# Patient Record
Sex: Female | Born: 1945 | Race: Black or African American | Hispanic: No | Marital: Married | Smoking: Never smoker
Health system: Southern US, Community
[De-identification: ages and names within clinical notes are randomized; demographics above are authoritative.]

## PROBLEM LIST (undated history)

## (undated) DIAGNOSIS — E785 Hyperlipidemia, unspecified: Secondary | ICD-10-CM

## (undated) DIAGNOSIS — I10 Essential (primary) hypertension: Secondary | ICD-10-CM

## (undated) HISTORY — DX: Essential (primary) hypertension: I10

## (undated) HISTORY — DX: Hyperlipidemia, unspecified: E78.5

---

## 2003-09-12 ENCOUNTER — Ambulatory Visit (HOSPITAL_COMMUNITY): Admission: RE | Admit: 2003-09-12 | Discharge: 2003-09-12 | Payer: Self-pay | Admitting: Gastroenterology

## 2005-02-18 ENCOUNTER — Other Ambulatory Visit: Admission: RE | Admit: 2005-02-18 | Discharge: 2005-02-18 | Payer: Self-pay | Admitting: Family Medicine

## 2006-06-04 ENCOUNTER — Other Ambulatory Visit: Admission: RE | Admit: 2006-06-04 | Discharge: 2006-06-04 | Payer: Self-pay | Admitting: Family Medicine

## 2007-06-09 ENCOUNTER — Other Ambulatory Visit: Admission: RE | Admit: 2007-06-09 | Discharge: 2007-06-09 | Payer: Self-pay | Admitting: Family Medicine

## 2008-09-21 ENCOUNTER — Other Ambulatory Visit: Admission: RE | Admit: 2008-09-21 | Discharge: 2008-09-21 | Payer: Self-pay | Admitting: Family Medicine

## 2009-11-19 ENCOUNTER — Other Ambulatory Visit: Admission: RE | Admit: 2009-11-19 | Discharge: 2009-11-19 | Payer: Self-pay | Admitting: Family Medicine

## 2010-06-13 NOTE — Op Note (Signed)
[name], Rebecca Gross                         ACCOUNT NO.:  [id number]   MEDICAL RECORD NO.:  [id number]                   PATIENT TYPE:  AMB   LOCATION:  ENDO                                 FACILITY:  [facility]   PHYSICIAN:  Anselmo Rod, M.D.               DATE OF BIRTH:  [birth year]   DATE OF PROCEDURE:  09/12/2003  DATE OF DISCHARGE:                                 OPERATIVE REPORT   PROCEDURE:  Screening colonoscopy.   ENDOSCOPIST:  Anselmo Rod, M.D.   INSTRUMENT USED:  Olympus video colonoscope.   INDICATIONS FOR PROCEDURE:  A 64 year old [place] American female with  history of occasional bright red blood per rectum with hard stool.  Screening colonoscopy to rule out colonic polyps, masses, etc.   PREPROCEDURE PREPARATION:  Informed consent was procured from the patient.  The patient was fasted for eight hours prior to the procedure and prepped  with a bottle of magnesium citrate and a gallon of GoLYTELY the night prior  to the procedure.   PREPROCEDURE PHYSICAL:  VITAL SIGNS:  The patient had stable vital signs.  CHEST:  Clear to auscultation.  HEART:  S1 and S2 regular.  ABDOMEN:  Soft with normal bowel sounds.   DESCRIPTION OF PROCEDURE:  The patient was placed in the left lateral  decubitus position, sedated with 75 mg Demerol and 7.5 mg Versed in slow  incremental doses.  Once the patient was adequately sedated and maintained  on low-flow oxygen and continuous cardiac monitoring, the Olympus video  colonoscope was advanced from the rectum to the cecum.  The appendiceal  orifice and ileocecal valve were clearly visualized and photographed.  No  masses, polyps, erosions, ulcerations, or diverticula were seen.  Prominent  internal hemorrhoids were seen on retroflexion in the rectum.  The patient  tolerated the procedure well without immediate complications.   IMPRESSION:  1. Normal colonoscopy to the cecum except for small internal hemorrhoids.  2. 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   RECOMMENDATIONS:  1. Continue high fiber diet with liberal fluid intake.  2. Outpatient followup as needed __________ in the future.  3. Repeat colonoscopy in the next 10 years unless the patient has any     abnormal GI symptoms prior to that.                                               Anselmo Rod, M.D.    JNM/MEDQ  D:  09/12/2003  T:  09/12/2003  Job:  [id number]   cc:   Gabriel Earing, M.D.  [address]  [place]  [place] [address]  Fax: [phone]

## 2011-06-01 DIAGNOSIS — E669 Obesity, unspecified: Secondary | ICD-10-CM | POA: Diagnosis not present

## 2011-06-01 DIAGNOSIS — I10 Essential (primary) hypertension: Secondary | ICD-10-CM | POA: Diagnosis not present

## 2011-06-01 DIAGNOSIS — E7800 Pure hypercholesterolemia, unspecified: Secondary | ICD-10-CM | POA: Diagnosis not present

## 2011-11-26 DIAGNOSIS — Z1231 Encounter for screening mammogram for malignant neoplasm of breast: Secondary | ICD-10-CM | POA: Diagnosis not present

## 2012-03-02 DIAGNOSIS — E7800 Pure hypercholesterolemia, unspecified: Secondary | ICD-10-CM | POA: Diagnosis not present

## 2012-03-02 DIAGNOSIS — Z1211 Encounter for screening for malignant neoplasm of colon: Secondary | ICD-10-CM | POA: Diagnosis not present

## 2012-03-02 DIAGNOSIS — Z0000 Encounter for general adult medical examination without abnormal findings: Secondary | ICD-10-CM | POA: Diagnosis not present

## 2012-03-02 DIAGNOSIS — Z1331 Encounter for screening for depression: Secondary | ICD-10-CM | POA: Diagnosis not present

## 2012-03-02 DIAGNOSIS — I10 Essential (primary) hypertension: Secondary | ICD-10-CM | POA: Diagnosis not present

## 2012-03-02 DIAGNOSIS — E669 Obesity, unspecified: Secondary | ICD-10-CM | POA: Diagnosis not present

## 2012-05-02 DIAGNOSIS — Z1211 Encounter for screening for malignant neoplasm of colon: Secondary | ICD-10-CM | POA: Diagnosis not present

## 2012-09-22 DIAGNOSIS — Z6830 Body mass index (BMI) 30.0-30.9, adult: Secondary | ICD-10-CM | POA: Diagnosis not present

## 2012-09-22 DIAGNOSIS — I10 Essential (primary) hypertension: Secondary | ICD-10-CM | POA: Diagnosis not present

## 2012-09-22 DIAGNOSIS — E7800 Pure hypercholesterolemia, unspecified: Secondary | ICD-10-CM | POA: Diagnosis not present

## 2012-09-22 DIAGNOSIS — E669 Obesity, unspecified: Secondary | ICD-10-CM | POA: Diagnosis not present

## 2013-02-08 DIAGNOSIS — Z780 Asymptomatic menopausal state: Secondary | ICD-10-CM | POA: Diagnosis not present

## 2013-02-08 DIAGNOSIS — Z1231 Encounter for screening mammogram for malignant neoplasm of breast: Secondary | ICD-10-CM | POA: Diagnosis not present

## 2013-06-30 DIAGNOSIS — I10 Essential (primary) hypertension: Secondary | ICD-10-CM | POA: Diagnosis not present

## 2013-06-30 DIAGNOSIS — E669 Obesity, unspecified: Secondary | ICD-10-CM | POA: Diagnosis not present

## 2013-06-30 DIAGNOSIS — E7800 Pure hypercholesterolemia, unspecified: Secondary | ICD-10-CM | POA: Diagnosis not present

## 2013-06-30 DIAGNOSIS — Z6830 Body mass index (BMI) 30.0-30.9, adult: Secondary | ICD-10-CM | POA: Diagnosis not present

## 2014-03-22 DIAGNOSIS — Z1231 Encounter for screening mammogram for malignant neoplasm of breast: Secondary | ICD-10-CM | POA: Diagnosis not present

## 2014-03-22 DIAGNOSIS — Z803 Family history of malignant neoplasm of breast: Secondary | ICD-10-CM | POA: Diagnosis not present

## 2014-05-29 DIAGNOSIS — Z6830 Body mass index (BMI) 30.0-30.9, adult: Secondary | ICD-10-CM | POA: Diagnosis not present

## 2014-05-29 DIAGNOSIS — E780 Pure hypercholesterolemia: Secondary | ICD-10-CM | POA: Diagnosis not present

## 2014-05-29 DIAGNOSIS — Z0001 Encounter for general adult medical examination with abnormal findings: Secondary | ICD-10-CM | POA: Diagnosis not present

## 2014-05-29 DIAGNOSIS — E6609 Other obesity due to excess calories: Secondary | ICD-10-CM | POA: Diagnosis not present

## 2014-05-29 DIAGNOSIS — Z23 Encounter for immunization: Secondary | ICD-10-CM | POA: Diagnosis not present

## 2014-05-29 DIAGNOSIS — Z1211 Encounter for screening for malignant neoplasm of colon: Secondary | ICD-10-CM | POA: Diagnosis not present

## 2014-05-29 DIAGNOSIS — I10 Essential (primary) hypertension: Secondary | ICD-10-CM | POA: Diagnosis not present

## 2014-06-19 DIAGNOSIS — Z1211 Encounter for screening for malignant neoplasm of colon: Secondary | ICD-10-CM | POA: Diagnosis not present

## 2014-06-19 DIAGNOSIS — K5900 Constipation, unspecified: Secondary | ICD-10-CM | POA: Diagnosis not present

## 2014-07-18 DIAGNOSIS — K635 Polyp of colon: Secondary | ICD-10-CM | POA: Diagnosis not present

## 2014-07-18 DIAGNOSIS — Z1211 Encounter for screening for malignant neoplasm of colon: Secondary | ICD-10-CM | POA: Diagnosis not present

## 2014-07-18 DIAGNOSIS — D124 Benign neoplasm of descending colon: Secondary | ICD-10-CM | POA: Diagnosis not present

## 2014-12-05 DIAGNOSIS — E7800 Pure hypercholesterolemia, unspecified: Secondary | ICD-10-CM | POA: Diagnosis not present

## 2014-12-05 DIAGNOSIS — E669 Obesity, unspecified: Secondary | ICD-10-CM | POA: Diagnosis not present

## 2014-12-05 DIAGNOSIS — I10 Essential (primary) hypertension: Secondary | ICD-10-CM | POA: Diagnosis not present

## 2014-12-05 DIAGNOSIS — Z6830 Body mass index (BMI) 30.0-30.9, adult: Secondary | ICD-10-CM | POA: Diagnosis not present

## 2015-05-14 DIAGNOSIS — Z13820 Encounter for screening for osteoporosis: Secondary | ICD-10-CM | POA: Diagnosis not present

## 2015-05-14 DIAGNOSIS — Z1231 Encounter for screening mammogram for malignant neoplasm of breast: Secondary | ICD-10-CM | POA: Diagnosis not present

## 2015-05-14 DIAGNOSIS — Z803 Family history of malignant neoplasm of breast: Secondary | ICD-10-CM | POA: Diagnosis not present

## 2015-05-28 DIAGNOSIS — H6123 Impacted cerumen, bilateral: Secondary | ICD-10-CM | POA: Diagnosis not present

## 2015-07-01 DIAGNOSIS — Z0000 Encounter for general adult medical examination without abnormal findings: Secondary | ICD-10-CM | POA: Diagnosis not present

## 2015-07-01 DIAGNOSIS — E7800 Pure hypercholesterolemia, unspecified: Secondary | ICD-10-CM | POA: Diagnosis not present

## 2015-07-01 DIAGNOSIS — E669 Obesity, unspecified: Secondary | ICD-10-CM | POA: Diagnosis not present

## 2015-07-01 DIAGNOSIS — I10 Essential (primary) hypertension: Secondary | ICD-10-CM | POA: Diagnosis not present

## 2015-11-19 DIAGNOSIS — L299 Pruritus, unspecified: Secondary | ICD-10-CM | POA: Diagnosis not present

## 2015-11-19 DIAGNOSIS — I160 Hypertensive urgency: Secondary | ICD-10-CM | POA: Diagnosis not present

## 2015-11-19 DIAGNOSIS — M7662 Achilles tendinitis, left leg: Secondary | ICD-10-CM | POA: Diagnosis not present

## 2015-11-29 DIAGNOSIS — M7662 Achilles tendinitis, left leg: Secondary | ICD-10-CM | POA: Diagnosis not present

## 2015-12-10 ENCOUNTER — Other Ambulatory Visit: Payer: Self-pay | Admitting: Physician Assistant

## 2015-12-10 ENCOUNTER — Ambulatory Visit
Admission: RE | Admit: 2015-12-10 | Discharge: 2015-12-10 | Disposition: A | Discharge status: Home / Self Care | Payer: Medicare Other | Source: Ambulatory Visit | Attending: Physician Assistant | Admitting: Physician Assistant

## 2015-12-10 DIAGNOSIS — M19072 Primary osteoarthritis, left ankle and foot: Secondary | ICD-10-CM | POA: Diagnosis not present

## 2015-12-10 DIAGNOSIS — R52 Pain, unspecified: Secondary | ICD-10-CM

## 2015-12-10 DIAGNOSIS — M25579 Pain in unspecified ankle and joints of unspecified foot: Secondary | ICD-10-CM | POA: Diagnosis not present

## 2016-03-17 DIAGNOSIS — E7800 Pure hypercholesterolemia, unspecified: Secondary | ICD-10-CM | POA: Diagnosis not present

## 2016-03-17 DIAGNOSIS — I10 Essential (primary) hypertension: Secondary | ICD-10-CM | POA: Diagnosis not present

## 2016-03-17 DIAGNOSIS — E669 Obesity, unspecified: Secondary | ICD-10-CM | POA: Diagnosis not present

## 2016-06-08 DIAGNOSIS — Z1231 Encounter for screening mammogram for malignant neoplasm of breast: Secondary | ICD-10-CM | POA: Diagnosis not present

## 2016-06-08 DIAGNOSIS — Z803 Family history of malignant neoplasm of breast: Secondary | ICD-10-CM | POA: Diagnosis not present

## 2016-06-09 DIAGNOSIS — M25572 Pain in left ankle and joints of left foot: Secondary | ICD-10-CM | POA: Diagnosis not present

## 2016-07-02 DIAGNOSIS — M25572 Pain in left ankle and joints of left foot: Secondary | ICD-10-CM | POA: Diagnosis not present

## 2016-10-02 DIAGNOSIS — Z1159 Encounter for screening for other viral diseases: Secondary | ICD-10-CM | POA: Diagnosis not present

## 2016-10-02 DIAGNOSIS — E7800 Pure hypercholesterolemia, unspecified: Secondary | ICD-10-CM | POA: Diagnosis not present

## 2016-10-02 DIAGNOSIS — Z0000 Encounter for general adult medical examination without abnormal findings: Secondary | ICD-10-CM | POA: Diagnosis not present

## 2016-10-02 DIAGNOSIS — E669 Obesity, unspecified: Secondary | ICD-10-CM | POA: Diagnosis not present

## 2016-10-02 DIAGNOSIS — I10 Essential (primary) hypertension: Secondary | ICD-10-CM | POA: Diagnosis not present

## 2017-04-13 DIAGNOSIS — I10 Essential (primary) hypertension: Secondary | ICD-10-CM | POA: Diagnosis not present

## 2017-04-13 DIAGNOSIS — E7800 Pure hypercholesterolemia, unspecified: Secondary | ICD-10-CM | POA: Diagnosis not present

## 2017-04-29 DIAGNOSIS — R7301 Impaired fasting glucose: Secondary | ICD-10-CM | POA: Diagnosis not present

## 2017-05-13 IMAGING — CR DG ANKLE COMPLETE 3+V*L*
3 series · 3 of 3 positions shown · non-contrast
Comparison: None.

CLINICAL DATA: Pain for 1 month

EXAM:
LEFT ANKLE COMPLETE - 3+ VIEW

[t ankle joint ap left]
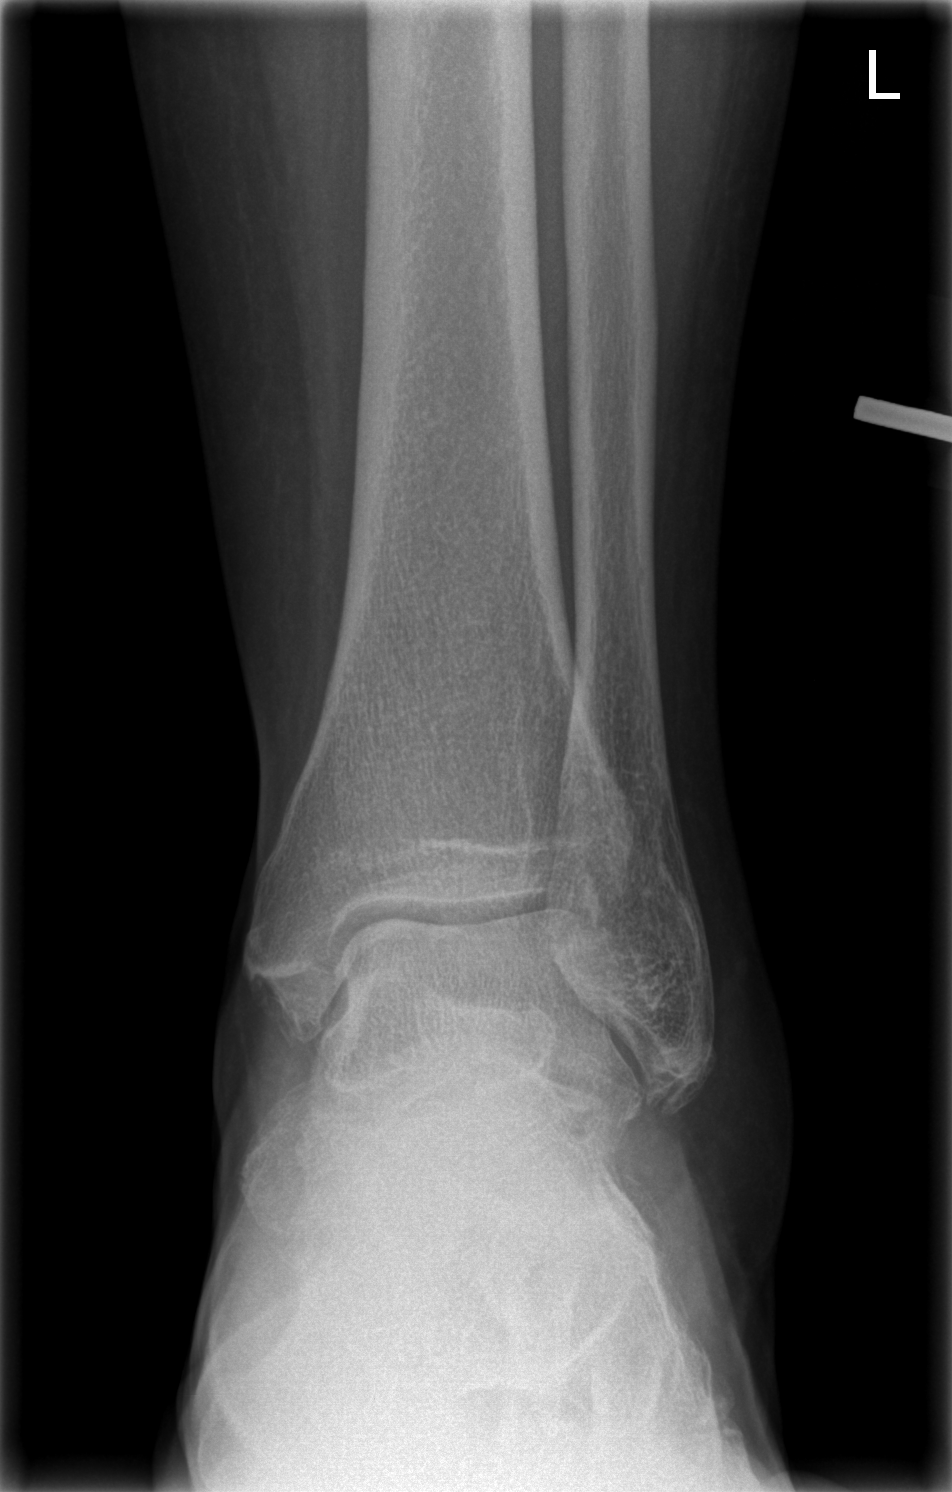

[t ankle joint oblique left]
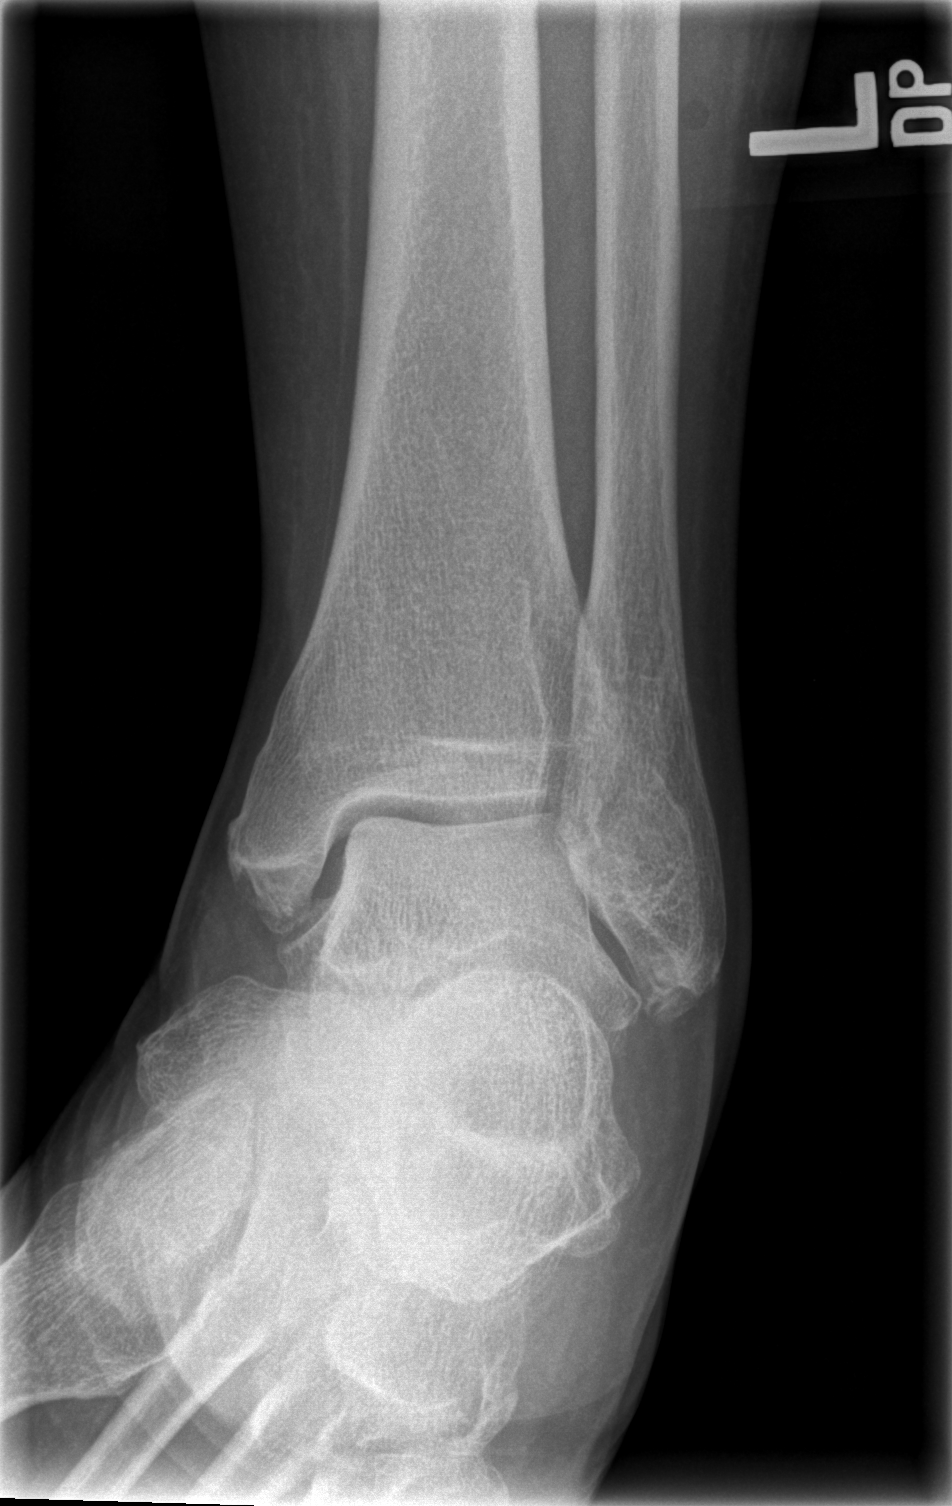

[t ankle joint lat left]
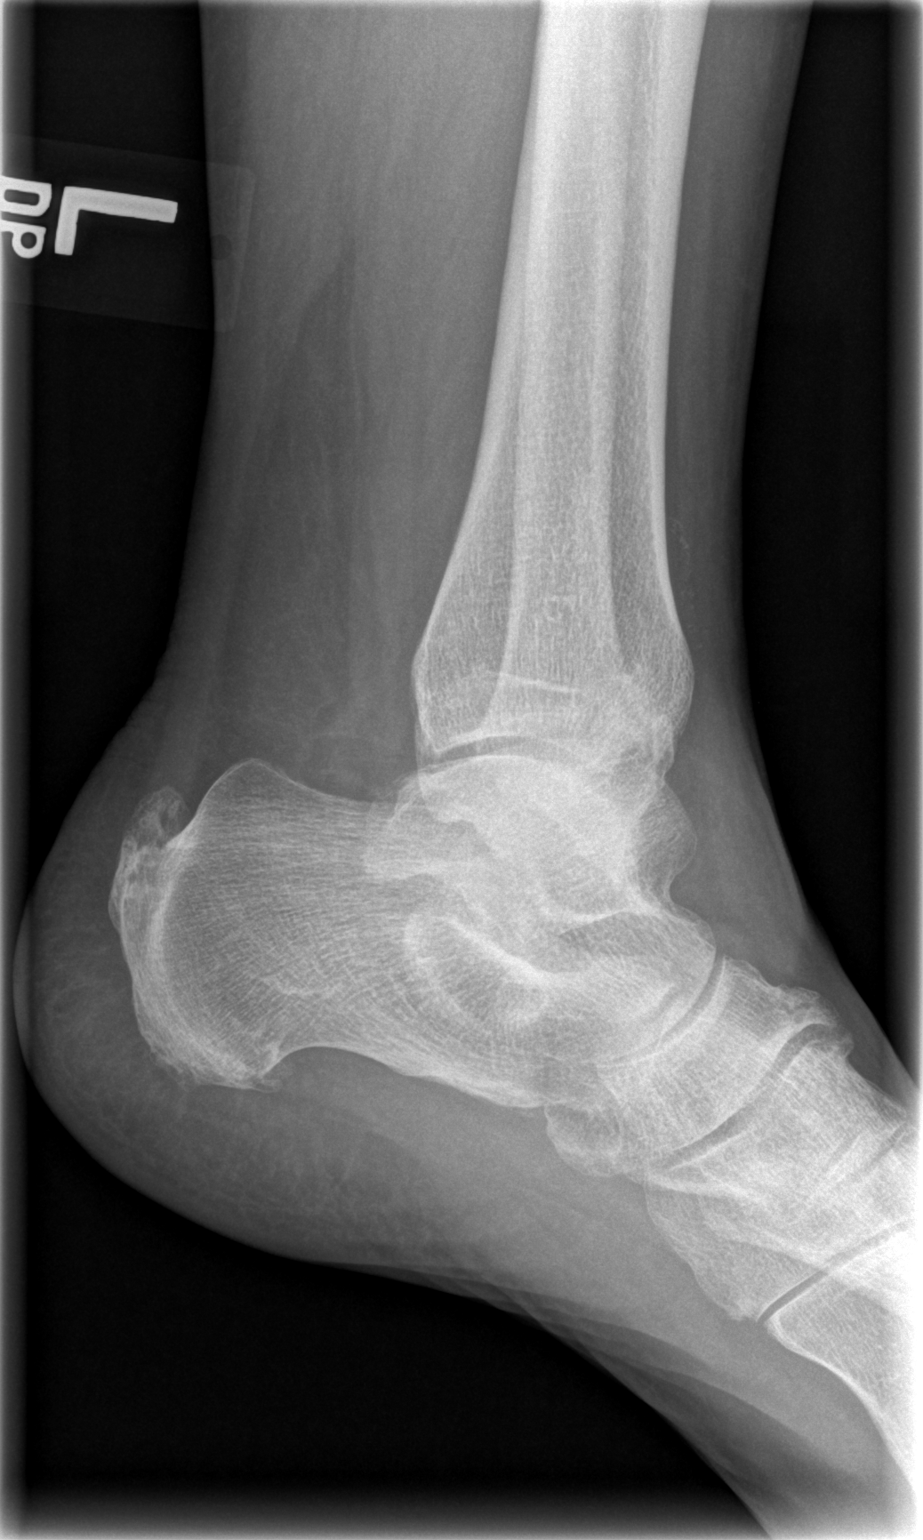

[3 of 3 positions shown; findings below may reference images not displayed]

FINDINGS: Frontal, lateral, and oblique views obtained. There is soft tissue
swelling laterally. There is no evident fracture or joint effusion.
The ankle mortise appears intact. There is moderate generalized
osteoarthritic change in the ankle joint. There is spurring in the
dorsal midfoot. There are spurs arising from the inferior and
posterior calcaneus.
IMPRESSION: Osteoarthritic change in the ankle joint. Soft tissue swelling
laterally. No evident fracture. Ankle mortise appears intact. There
are calcaneal spurs as well as spurring in the dorsal midfoot.

## 2017-07-08 DIAGNOSIS — Z1231 Encounter for screening mammogram for malignant neoplasm of breast: Secondary | ICD-10-CM | POA: Diagnosis not present

## 2017-07-08 DIAGNOSIS — Z803 Family history of malignant neoplasm of breast: Secondary | ICD-10-CM | POA: Diagnosis not present

## 2017-07-16 DIAGNOSIS — E7800 Pure hypercholesterolemia, unspecified: Secondary | ICD-10-CM | POA: Diagnosis not present

## 2017-10-21 DIAGNOSIS — Z0000 Encounter for general adult medical examination without abnormal findings: Secondary | ICD-10-CM | POA: Diagnosis not present

## 2017-10-21 DIAGNOSIS — Z6830 Body mass index (BMI) 30.0-30.9, adult: Secondary | ICD-10-CM | POA: Diagnosis not present

## 2017-10-21 DIAGNOSIS — E7800 Pure hypercholesterolemia, unspecified: Secondary | ICD-10-CM | POA: Diagnosis not present

## 2017-10-21 DIAGNOSIS — E669 Obesity, unspecified: Secondary | ICD-10-CM | POA: Diagnosis not present

## 2017-10-21 DIAGNOSIS — R7301 Impaired fasting glucose: Secondary | ICD-10-CM | POA: Diagnosis not present

## 2017-10-21 DIAGNOSIS — I10 Essential (primary) hypertension: Secondary | ICD-10-CM | POA: Diagnosis not present

## 2017-10-21 DIAGNOSIS — Z23 Encounter for immunization: Secondary | ICD-10-CM | POA: Diagnosis not present

## 2018-04-11 DIAGNOSIS — H6123 Impacted cerumen, bilateral: Secondary | ICD-10-CM | POA: Diagnosis not present

## 2018-04-27 DIAGNOSIS — Z6830 Body mass index (BMI) 30.0-30.9, adult: Secondary | ICD-10-CM | POA: Diagnosis not present

## 2018-04-27 DIAGNOSIS — I10 Essential (primary) hypertension: Secondary | ICD-10-CM | POA: Diagnosis not present

## 2018-04-27 DIAGNOSIS — E669 Obesity, unspecified: Secondary | ICD-10-CM | POA: Diagnosis not present

## 2018-04-27 DIAGNOSIS — R7301 Impaired fasting glucose: Secondary | ICD-10-CM | POA: Diagnosis not present

## 2018-04-27 DIAGNOSIS — E7800 Pure hypercholesterolemia, unspecified: Secondary | ICD-10-CM | POA: Diagnosis not present

## 2018-05-24 ENCOUNTER — Other Ambulatory Visit: Payer: Self-pay

## 2018-05-24 ENCOUNTER — Ambulatory Visit (INDEPENDENT_AMBULATORY_CARE_PROVIDER_SITE_OTHER): Payer: Medicare Other | Admitting: Family Medicine

## 2018-05-24 ENCOUNTER — Encounter: Payer: Self-pay | Admitting: Family Medicine

## 2018-05-24 VITALS — Resp 16

## 2018-05-24 DIAGNOSIS — W19XXXA Unspecified fall, initial encounter: Secondary | ICD-10-CM | POA: Diagnosis not present

## 2018-05-24 DIAGNOSIS — M25562 Pain in left knee: Secondary | ICD-10-CM

## 2018-05-24 DIAGNOSIS — Y92009 Unspecified place in unspecified non-institutional (private) residence as the place of occurrence of the external cause: Secondary | ICD-10-CM | POA: Diagnosis not present

## 2018-05-24 DIAGNOSIS — T1490XA Injury, unspecified, initial encounter: Secondary | ICD-10-CM

## 2018-05-24 DIAGNOSIS — I10 Essential (primary) hypertension: Secondary | ICD-10-CM

## 2018-05-24 MED ORDER — NAPROXEN 375 MG PO TABS: 375.0000 mg | ORAL_TABLET | Freq: Two times a day (BID) | ORAL | 0 refills | Status: AC

## 2018-05-24 NOTE — Assessment & Plan Note (Signed)
Problem does not seem to be well controlled. Recommend taking Amlodipine 5 mg daily,continue monitoring BP. Side effects of NSAID's discussed. Continue following with PCP.

## 2018-05-24 NOTE — Progress Notes (Signed)
Virtual Visit via Video Note   I connected with Rebecca Gross on 05/24/18 at  3:00 PM EDT by a video enabled telemedicine application and verified that I am speaking with the correct person using two identifiers.  Location patient: home Location provider:work or home office Persons participating in the virtual visit: patient, provider  I discussed the limitations of evaluation and management by telemedicine and the availability of in person appointments. She expressed understanding and agreed to proceed.   HPI: PCP was not available, Dr Valere Dross. Rebecca Gross is a 74 yo AA female c/o left knee pain after suffering a fall from treadmill 5 days ago. She was walking the treadmill, lost balance and slid. Landed on right knee, treadmill continue moving causing superficial excoriating on left knee and some on left thigh.  Left knee is "little sore",intermittent 4/10, exacerbated by movement but with no limitation of ROM or deformity. Alleviated by rest. Problem is improving.  She has not tried OTC medications. Denies Hx of knee OA.  She was trying to reach pcp and just received recommendations about applying OTC topical treatment,Arnica cream.    Hx of HTN, BP reading 140's-150's/80's. She does not take Amlodipine 5 mg daily but q 2 days. Denies severe/frequent headache, visual changes, chest pain, dyspnea, palpitation, claudication, focal weakness, or edema.  Denies Hx of DM or renal disease.  ROS: See pertinent positives and negatives per HPI. COVID-19 screening questions: Denies new fever,cough,sore throat,or possible exposure to COVID-19. Negative for loss in the sense of smell or taste.  Past Medical History:  Diagnosis Date  . Hyperlipidemia   . Hypertension     No family history on file.  Social History   Socioeconomic History  . Marital status: Unknown    Spouse name: Not on file  . Number of children: Not on file  . Years of education: Not on file  . Highest education  level: Not on file  Occupational History  . Not on file  Social Needs  . Financial resource strain: Not on file  . Food insecurity:    Worry: Not on file    Inability: Not on file  . Transportation needs:    Medical: Not on file    Non-medical: Not on file  Tobacco Use  . Smoking status: Never Smoker  . Smokeless tobacco: Never Used  Substance and Sexual Activity  . Alcohol use: Not on file  . Drug use: Not on file  . Sexual activity: Not on file  Lifestyle  . Physical activity:    Days per week: Not on file    Minutes per session: Not on file  . Stress: Not on file  Relationships  . Social connections:    Talks on phone: Not on file    Gets together: Not on file    Attends religious service: Not on file    Active member of club or organization: Not on file    Attends meetings of clubs or organizations: Not on file    Relationship status: Not on file  . Intimate partner violence:    Fear of current or ex partner: Not on file    Emotionally abused: Not on file    Physically abused: Not on file    Forced sexual activity: Not on file  Other Topics Concern  . Not on file  Social History Narrative  . Not on file      Current Outpatient Medications:  .  naproxen (NAPROSYN) 375 MG tablet, Take 1 tablet (  375 mg total) by mouth 2 (two) times daily with a meal for 7 days., Disp: 14 tablet, Rfl: 0  EXAM:  VITALS per patient if applicable:Resp 16   GENERAL: alert, oriented, appears well and in no acute distress  HEENT: atraumatic, conjunctiva clear, no obvious facial abnormalities on inspection.  NECK: normal movements of the head and neck  LUNGS: on inspection no signs of respiratory distress, breathing rate appears normal, no obvious gross SOB, gasping or wheezing  CV: no obvious cyanosis  Rebecca: moves all visible extremities without noticeable abnormality. Left knee no limitation of flexion and extension, not significant pain elicited,no deformity appreciated.   SKIN: Superficial excoriation on anterior aspect left knee and in the lower degree on anterior rapid aspect of right thigh. See pictures.  PSYCH/NEURO: pleasant and cooperative, no obvious depression or anxiety, speech and thought processing grossly intact    Anterior and upper aspect right thigh.   Anterior aspect left knee.  ASSESSMENT AND PLAN:  Discussed the following assessment and plan:  Acute pain of left knee Pain is improving and it is not limiting daily activities. I do not think imaging is needed at this time. Naproxen 375 mg bid for 5-7 days with food recommended,side effects discussed.  Fall at home, initial encounter Fall precaution discussed.  Soft tissue injury Keep area clean with soap and water. Monitor for signs of infection. Td recommended if not up to date.   Hypertension, essential, benign Problem does not seem to be well controlled. Recommend taking Amlodipine 5 mg daily,continue monitoring BP. Side effects of NSAID's discussed. Continue following with PCP.     I discussed the assessment and treatment plan with the patient. The patient was provided an opportunity to ask questions and all were answered. The patient agreed with the plan and demonstrated an understanding of the instructions.   The patient was advised to call back or seek an in-person evaluation if the symptoms worsen or if the condition fails to improve as anticipated.  Return if symptoms worsen or fail to improve.     [place], MD

## 2018-06-03 DIAGNOSIS — S80211A Abrasion, right knee, initial encounter: Secondary | ICD-10-CM | POA: Diagnosis not present

## 2018-06-03 DIAGNOSIS — S80212A Abrasion, left knee, initial encounter: Secondary | ICD-10-CM | POA: Diagnosis not present

## 2018-06-03 DIAGNOSIS — T148XXA Other injury of unspecified body region, initial encounter: Secondary | ICD-10-CM | POA: Diagnosis not present

## 2018-07-20 DIAGNOSIS — Z803 Family history of malignant neoplasm of breast: Secondary | ICD-10-CM | POA: Diagnosis not present

## 2018-07-20 DIAGNOSIS — Z1231 Encounter for screening mammogram for malignant neoplasm of breast: Secondary | ICD-10-CM | POA: Diagnosis not present

## 2018-10-12 ENCOUNTER — Other Ambulatory Visit: Payer: Self-pay

## 2018-10-12 DIAGNOSIS — Z20822 Contact with and (suspected) exposure to covid-19: Secondary | ICD-10-CM

## 2018-10-13 LAB — NOVEL CORONAVIRUS, NAA: SARS-CoV-2, NAA: NOT DETECTED

## 2018-11-18 DIAGNOSIS — Z23 Encounter for immunization: Secondary | ICD-10-CM | POA: Diagnosis not present

## 2019-05-05 ENCOUNTER — Other Ambulatory Visit: Payer: Self-pay

## 2019-05-05 ENCOUNTER — Ambulatory Visit (INDEPENDENT_AMBULATORY_CARE_PROVIDER_SITE_OTHER): Payer: Medicare Other | Admitting: Otolaryngology

## 2019-05-05 DIAGNOSIS — H6123 Impacted cerumen, bilateral: Secondary | ICD-10-CM | POA: Diagnosis not present

## 2019-05-05 NOTE — Progress Notes (Signed)
HPI: Rebecca Gross is a 74 y.o. female who presents for evaluation of cerumen buildup in both ears.  She was last seen 03/27/2018.  She complains of some blockage in her ears.  She has been using Q-tips..  Past Medical History:  Diagnosis Date  . Hyperlipidemia   . Hypertension     Social History   Socioeconomic History  . Marital status: Unknown    Spouse name: Not on file  . Number of children: Not on file  . Years of education: Not on file  . Highest education level: Not on file  Occupational History  . Not on file  Tobacco Use  . Smoking status: Never Smoker  . Smokeless tobacco: Never Used  Substance and Sexual Activity  . Alcohol use: Not on file  . Drug use: Not on file  . Sexual activity: Not on file  Other Topics Concern  . Not on file  Social History Narrative  . Not on file   Social Determinants of Health   Financial Resource Strain:   . Difficulty of Paying Living Expenses:   Food Insecurity:   . Worried About Charity fundraiser in the Last Year:   . Arboriculturist in the Last Year:   Transportation Needs:   . Film/video editor (Medical):   Marland Kitchen Lack of Transportation (Non-Medical):   Physical Activity:   . Days of Exercise per Week:   . Minutes of Exercise per Session:   Stress:   . Feeling of Stress :   Social Connections:   . Frequency of Communication with Friends and Family:   . Frequency of Social Gatherings with Friends and Family:   . Attends Religious Services:   . Active Member of Clubs or Organizations:   . Attends Archivist Meetings:   Marland Kitchen Marital Status:    No family history on file. No Known Allergies Prior to Admission medications   Not on File     Positive ROS: Otherwise negative  All other systems have been reviewed and were otherwise negative with the exception of those mentioned in the HPI and as above.  Physical Exam: Constitutional: Alert, well-appearing, no acute distress Ears: External ears without  lesions or tenderness. Ear canals she has wax pushed down adjacent to the both TMs from use of Q-tips.  This was cleaned in the office using suction and forceps.  TMs were clear bilaterally.. Nasal: External nose without lesions. Clear nasal passages Oral: Oropharynx clear. Neck: No palpable adenopathy or masses Respiratory: Breathing comfortably  Skin: No facial/neck lesions or rash noted.  Cerumen impaction removal  Date/Time: 05/05/2019 5:10 PM Performed by: Rozetta Nunnery, MD Authorized by: Rozetta Nunnery, MD   Consent:    Consent obtained:  Verbal   Consent given by:  Patient   Risks discussed:  Pain and bleeding Procedure details:    Location:  L ear and R ear   Procedure type: suction and forceps   Post-procedure details:    Inspection:  TM intact and canal normal   Hearing quality:  Improved   Patient tolerance of procedure:  Tolerated well, no immediate complications Comments:     TMs are clear bilaterally.    Assessment: Bilateral cerumen impactions  Plan: She will follow-up as needed.  Cautioned her about not using Q-tips.  Radene Journey, MD

## 2019-06-21 DIAGNOSIS — E669 Obesity, unspecified: Secondary | ICD-10-CM | POA: Diagnosis not present

## 2019-06-21 DIAGNOSIS — E7800 Pure hypercholesterolemia, unspecified: Secondary | ICD-10-CM | POA: Diagnosis not present

## 2019-06-21 DIAGNOSIS — Z0000 Encounter for general adult medical examination without abnormal findings: Secondary | ICD-10-CM | POA: Diagnosis not present

## 2019-06-21 DIAGNOSIS — R7301 Impaired fasting glucose: Secondary | ICD-10-CM | POA: Diagnosis not present

## 2019-06-21 DIAGNOSIS — I10 Essential (primary) hypertension: Secondary | ICD-10-CM | POA: Diagnosis not present

## 2019-07-24 DIAGNOSIS — M85852 Other specified disorders of bone density and structure, left thigh: Secondary | ICD-10-CM | POA: Diagnosis not present

## 2019-07-24 DIAGNOSIS — Z1231 Encounter for screening mammogram for malignant neoplasm of breast: Secondary | ICD-10-CM | POA: Diagnosis not present

## 2019-07-24 DIAGNOSIS — Z780 Asymptomatic menopausal state: Secondary | ICD-10-CM | POA: Diagnosis not present

## 2019-09-27 DIAGNOSIS — E7800 Pure hypercholesterolemia, unspecified: Secondary | ICD-10-CM | POA: Diagnosis not present

## 2019-11-25 ENCOUNTER — Ambulatory Visit: Payer: Medicare Other | Attending: Internal Medicine

## 2019-11-25 DIAGNOSIS — Z23 Encounter for immunization: Secondary | ICD-10-CM

## 2019-12-18 DIAGNOSIS — E7800 Pure hypercholesterolemia, unspecified: Secondary | ICD-10-CM | POA: Diagnosis not present

## 2019-12-18 DIAGNOSIS — R7301 Impaired fasting glucose: Secondary | ICD-10-CM | POA: Diagnosis not present

## 2019-12-18 DIAGNOSIS — I10 Essential (primary) hypertension: Secondary | ICD-10-CM | POA: Diagnosis not present

## 2019-12-18 DIAGNOSIS — E669 Obesity, unspecified: Secondary | ICD-10-CM | POA: Diagnosis not present

## 2020-05-17 DIAGNOSIS — H6123 Impacted cerumen, bilateral: Secondary | ICD-10-CM | POA: Diagnosis not present

## 2020-06-21 DIAGNOSIS — Z0000 Encounter for general adult medical examination without abnormal findings: Secondary | ICD-10-CM | POA: Diagnosis not present

## 2020-06-21 DIAGNOSIS — R7301 Impaired fasting glucose: Secondary | ICD-10-CM | POA: Diagnosis not present

## 2020-06-21 DIAGNOSIS — I10 Essential (primary) hypertension: Secondary | ICD-10-CM | POA: Diagnosis not present

## 2020-06-21 DIAGNOSIS — Z1211 Encounter for screening for malignant neoplasm of colon: Secondary | ICD-10-CM | POA: Diagnosis not present

## 2020-06-21 DIAGNOSIS — E669 Obesity, unspecified: Secondary | ICD-10-CM | POA: Diagnosis not present

## 2020-06-21 DIAGNOSIS — E7800 Pure hypercholesterolemia, unspecified: Secondary | ICD-10-CM | POA: Diagnosis not present

## 2020-07-25 DIAGNOSIS — Z1231 Encounter for screening mammogram for malignant neoplasm of breast: Secondary | ICD-10-CM | POA: Diagnosis not present

## 2020-09-19 DIAGNOSIS — Z1211 Encounter for screening for malignant neoplasm of colon: Secondary | ICD-10-CM | POA: Diagnosis not present

## 2020-09-19 DIAGNOSIS — D696 Thrombocytopenia, unspecified: Secondary | ICD-10-CM | POA: Diagnosis not present

## 2020-09-19 DIAGNOSIS — K5900 Constipation, unspecified: Secondary | ICD-10-CM | POA: Diagnosis not present

## 2020-09-19 DIAGNOSIS — Z86010 Personal history of colonic polyps: Secondary | ICD-10-CM | POA: Diagnosis not present

## 2020-09-19 DIAGNOSIS — E669 Obesity, unspecified: Secondary | ICD-10-CM | POA: Diagnosis not present

## 2021-07-02 DIAGNOSIS — Z1211 Encounter for screening for malignant neoplasm of colon: Secondary | ICD-10-CM | POA: Diagnosis not present

## 2021-07-02 DIAGNOSIS — D123 Benign neoplasm of transverse colon: Secondary | ICD-10-CM | POA: Diagnosis not present

## 2021-07-02 DIAGNOSIS — K635 Polyp of colon: Secondary | ICD-10-CM | POA: Diagnosis not present

## 2021-07-02 DIAGNOSIS — K5730 Diverticulosis of large intestine without perforation or abscess without bleeding: Secondary | ICD-10-CM | POA: Diagnosis not present

## 2021-09-09 DIAGNOSIS — H6121 Impacted cerumen, right ear: Secondary | ICD-10-CM | POA: Diagnosis not present

## 2021-11-04 DIAGNOSIS — Z0000 Encounter for general adult medical examination without abnormal findings: Secondary | ICD-10-CM | POA: Diagnosis not present

## 2021-11-04 DIAGNOSIS — M8580 Other specified disorders of bone density and structure, unspecified site: Secondary | ICD-10-CM | POA: Diagnosis not present

## 2021-11-04 DIAGNOSIS — R7301 Impaired fasting glucose: Secondary | ICD-10-CM | POA: Diagnosis not present

## 2021-11-04 DIAGNOSIS — Z23 Encounter for immunization: Secondary | ICD-10-CM | POA: Diagnosis not present

## 2021-11-04 DIAGNOSIS — E7800 Pure hypercholesterolemia, unspecified: Secondary | ICD-10-CM | POA: Diagnosis not present

## 2021-11-04 DIAGNOSIS — I10 Essential (primary) hypertension: Secondary | ICD-10-CM | POA: Diagnosis not present

## 2021-11-04 DIAGNOSIS — E669 Obesity, unspecified: Secondary | ICD-10-CM | POA: Diagnosis not present

## 2022-03-23 DIAGNOSIS — R4189 Other symptoms and signs involving cognitive functions and awareness: Secondary | ICD-10-CM | POA: Diagnosis not present

## 2022-03-25 ENCOUNTER — Other Ambulatory Visit: Payer: Self-pay | Admitting: Family Medicine

## 2022-03-25 DIAGNOSIS — R4189 Other symptoms and signs involving cognitive functions and awareness: Secondary | ICD-10-CM

## 2022-03-26 ENCOUNTER — Encounter: Payer: Self-pay | Admitting: Physician Assistant

## 2022-04-03 ENCOUNTER — Encounter: Payer: Self-pay | Admitting: Physician Assistant

## 2022-04-07 ENCOUNTER — Other Ambulatory Visit: Payer: Medicare Other

## 2022-04-07 ENCOUNTER — Ambulatory Visit (INDEPENDENT_AMBULATORY_CARE_PROVIDER_SITE_OTHER): Payer: Medicare Other | Admitting: Physician Assistant

## 2022-04-07 ENCOUNTER — Encounter: Payer: Self-pay | Admitting: Physician Assistant

## 2022-04-07 VITALS — BP 140/78 | HR 67 | Resp 18 | Ht 62.0 in | Wt 173.0 lb

## 2022-04-07 DIAGNOSIS — R413 Other amnesia: Secondary | ICD-10-CM

## 2022-04-07 MED ORDER — DONEPEZIL HCL 10 MG PO TABS: ORAL_TABLET | ORAL | 3 refills | Status: DC

## 2022-04-07 NOTE — Patient Instructions (Addendum)
It was a pleasure to see you today at our office.   Recommendations:  MRI of the brain, the radiology office will call you to arrange you appointment Check labs today  Start Donepezil 10 mg :Take half tablet (5 mg) daily for 2 weeks, then increase to the full tablet at 10 mg daily.   Feel free to visit Facebook page " [email or web address]" for tips of how to care for people with memory problems.  For assessment of decision of mental capacity and competency:  Call Dr. Anthoney Harada, geriatric psychiatrist at [phone] Follow up in 3 months   Whom to call:  Memory  decline, memory medications: Call our office [phone]   For psychiatric meds, mood meds: Please have your primary care physician manage these medications.    If you have any severe symptoms of a stroke, or other severe issues such as confusion,severe chills or fever, etc call 911 or go to the ER as you may need to be evaluated further       RECOMMENDATIONS FOR ALL PATIENTS WITH MEMORY PROBLEMS: 1. Continue to exercise (Recommend 30 minutes of walking everyday, or 3 hours every week) 2. Increase social interactions - continue going to [place] and enjoy social gatherings with friends and family 3. Eat healthy, avoid fried foods and eat more fruits and vegetables 4. Maintain adequate blood pressure, blood sugar, and blood cholesterol level. Reducing the risk of stroke and cardiovascular disease also helps promoting better memory. 5. Avoid stressful situations. Live a simple life and avoid aggravations. Organize your time and prepare for the next day in anticipation. 6. Sleep well, avoid any interruptions of sleep and avoid any distractions in the bedroom that may interfere with adequate sleep quality 7. Avoid sugar, avoid sweets as there is a strong link between excessive sugar intake, diabetes, and cognitive impairment We discussed the Mediterranean diet, which has been shown to help patients reduce the risk of progressive memory  disorders and reduces cardiovascular risk. This includes eating fish, eat fruits and green leafy vegetables, nuts like almonds and hazelnuts, walnuts, and also use olive oil. Avoid fast foods and fried foods as much as possible. Avoid sweets and sugar as sugar use has been linked to worsening of memory function.  There is always a concern of gradual progression of memory problems. If this is the case, then we may need to adjust level of care according to patient needs. Support, both to the patient and caregiver, should then be put into place.        FALL PRECAUTIONS: Be cautious when walking. Scan the area for obstacles that may increase the risk of trips and falls. When getting up in the mornings, sit up at the edge of the bed for a few minutes before getting out of bed. Consider elevating the bed at the head end to avoid drop of blood pressure when getting up. Walk always in a well-lit room (use night lights in the walls). Avoid area rugs or power cords from appliances in the middle of the walkways. Use a walker or a cane if necessary and consider physical therapy for balance exercise. Get your eyesight checked regularly.  FINANCIAL OVERSIGHT: Supervision, especially oversight when making financial decisions or transactions is also recommended.  HOME SAFETY: Consider the safety of the kitchen when operating appliances like stoves, microwave oven, and blender. Consider having supervision and share cooking responsibilities until no longer able to participate in those. Accidents with firearms and other hazards in the house should be  identified and addressed as well.   ABILITY TO BE LEFT ALONE: If patient is unable to contact 911 operator, consider using LifeLine, or when the need is there, arrange for someone to stay with patients. Smoking is a fire hazard, consider supervision or cessation. Risk of wandering should be assessed by caregiver and if detected at any point, supervision and safe proof  recommendations should be instituted.  MEDICATION SUPERVISION: Inability to self-administer medication needs to be constantly addressed. Implement a mechanism to ensure safe administration of the medications.   DRIVING: Regarding driving, in patients with progressive memory problems, driving will be impaired. We advise to have someone else do the driving if trouble finding directions or if minor accidents are reported. Independent driving assessment is available to determine safety of driving.   If you are interested in the driving assessment, you can contact the following:  [employer] in [place]  [place] [phone] or [phone]    [place] refers to food and lifestyle choices that are based on the traditions of countries located on the [employer]. This way of eating has been shown to help prevent certain conditions and improve outcomes for people who have chronic diseases, like kidney disease and heart disease. What are tips for following this plan? Lifestyle  Cook and eat meals together with your family, when possible. Drink enough fluid to keep your urine clear or pale yellow. Be physically active every day. This includes: Aerobic exercise like running or swimming. Leisure activities like gardening, walking, or housework. Get 7-8 hours of sleep each night. If recommended by your health care provider, drink red wine in moderation. This means 1 glass a day for nonpregnant women and 2 glasses a day for men. A glass of wine equals 5 oz (150 mL). Reading food labels  Check the serving size of packaged foods. For foods such as rice and pasta, the serving size refers to the amount of cooked product, not dry. Check the total fat in packaged foods. Avoid foods that have saturated fat or trans fats. Check the ingredients list for  added sugars, such as corn syrup. Shopping  At the grocery store, buy most of your food from the areas near the walls of the store. This includes: Fresh fruits and vegetables (produce). Grains, beans, nuts, and seeds. Some of these may be available in unpackaged forms or large amounts (in bulk). Fresh seafood. Poultry and eggs. Low-fat dairy products. Buy whole ingredients instead of prepackaged foods. Buy fresh fruits and vegetables in-season from local farmers markets. Buy frozen fruits and vegetables in resealable bags. If you do not have access to quality fresh seafood, buy precooked frozen shrimp or canned fish, such as tuna, salmon, or sardines. Buy small amounts of raw or cooked vegetables, salads, or olives from the deli or salad bar at your store. Stock your pantry so you always have certain foods on hand, such as olive oil, canned tuna, canned tomatoes, rice, pasta, and beans. Cooking  Cook foods with extra-virgin olive oil instead of using butter or other vegetable oils. Have meat as a side dish, and have vegetables or grains as your main dish. This means having meat in small portions or adding small amounts of meat to foods like pasta or stew. Use beans or vegetables instead of meat in common dishes like chili or lasagna. Experiment with different cooking methods. Try roasting or broiling vegetables instead of  steaming or sauteing them. Add frozen vegetables to soups, stews, pasta, or rice. Add nuts or seeds for added healthy fat at each meal. You can add these to yogurt, salads, or vegetable dishes. Marinate fish or vegetables using olive oil, lemon juice, garlic, and fresh herbs. Meal planning  Plan to eat 1 vegetarian meal one day each week. Try to work up to 2 vegetarian meals, if possible. Eat seafood 2 or more times a week. Have healthy snacks readily available, such as: Vegetable sticks with hummus. Greek yogurt. Fruit and nut trail mix. Eat balanced meals throughout  the week. This includes: Fruit: 2-3 servings a day Vegetables: 4-5 servings a day Low-fat dairy: 2 servings a day Fish, poultry, or lean meat: 1 serving a day Beans and legumes: 2 or more servings a week Nuts and seeds: 1-2 servings a day Whole grains: 6-8 servings a day Extra-virgin olive oil: 3-4 servings a day Limit red meat and sweets to only a few servings a month What are my food choices? Mediterranean diet Recommended Grains: Whole-grain pasta. Brown rice. Bulgar wheat. Polenta. Couscous. Whole-wheat bread. [place]. Vegetables: Artichokes. Beets. Broccoli. Cabbage. Carrots. Eggplant. Green beans. Chard. Kale. Spinach. Onions. Leeks. Peas. Squash. Tomatoes. Peppers. Radishes. Fruits: Apples. Apricots. Avocado. Berries. Bananas. Cherries. Dates. Figs. Grapes. Lemons. Melon. Oranges. Peaches. Plums. Pomegranate. Meats and other protein foods: Beans. Almonds. Sunflower seeds. Pine nuts. Peanuts. [place]. Salmon. Scallops. Shrimp. [place]. Tilapia. Clams. Oysters. Eggs. Dairy: Low-fat milk. Cheese. Greek yogurt. Beverages: Water. Red wine. Herbal tea. Fats and oils: Extra virgin olive oil. Avocado oil. Grape seed oil. Sweets and desserts: [place] yogurt with honey. Baked apples. Poached pears. Trail mix. Seasoning and other foods: Basil. Cilantro. Coriander. Cumin. Mint. Parsley. Sage. Rosemary. Tarragon. Garlic. Oregano. Thyme. Pepper. Balsalmic vinegar. Tahini. Hummus. Tomato sauce. Olives. Mushrooms. Limit these Grains: Prepackaged pasta or rice dishes. Prepackaged cereal with added sugar. Vegetables: Deep fried potatoes (french fries). Fruits: Fruit canned in syrup. Meats and other protein foods: Beef. Pork. Lamb. Poultry with skin. Hot dogs. [place] Salines. Dairy: Ice cream. Sour cream. Whole milk. Beverages: Juice. Sugar-sweetened soft drinks. Beer. Liquor and spirits. Fats and oils: Butter. Canola oil. Vegetable oil. Beef fat (tallow). Lard. Sweets and desserts: Cookies. Cakes. Pies.  Candy. Seasoning and other foods: Mayonnaise. Premade sauces and marinades. The items listed may not be a complete list. Talk with your dietitian about what dietary choices are right for you. Summary The Mediterranean diet includes both food and lifestyle choices. Eat a variety of fresh fruits and vegetables, beans, nuts, seeds, and whole grains. Limit the amount of red meat and sweets that you eat. Talk with your health care provider about whether it is safe for you to drink red wine in moderation. This means 1 glass a day for nonpregnant women and 2 glasses a day for men. A glass of wine equals 5 oz (150 mL). This information is not intended to replace advice given to you by your health care provider. Make sure you discuss any questions you have with your health care provider. Document Released: 09/05/2015 Document Revised: 10/08/2015 Document Reviewed: 09/05/2015 Elsevier Interactive Patient Education  2017 [employer].

## 2022-04-07 NOTE — Progress Notes (Signed)
Assessment/Plan:    The patient is seen in neurologic consultation at the request of Mayra Neer, MD for the evaluation of memory.  Rebecca Gross is a very pleasant 77 y.o. year old RH female with a history of hypertension, hyperlipidemia, abnormal fasting glucose, osteopenia, seen today for evaluation of memory loss. MoCA today is 12/30.  During the visit, there is a  significant level of confabulation and anosognosia, and given her strong family history of Alzheimer's disease, high suspicion for this etiology is present although workup is in progress.  Memory Impairment with behavioral disturbance  MRI brain without contrast to assess for underlying structural abnormality and assess vascular load  Check B1  Start Donepezil 10 mg :Take half tablet (5 mg) daily for 2 weeks, then increase to the full tablet at 10 mg daily. Side effects discussed   Continue mood control as per PCP, patient is on Lexapro Continue to control cardiovascular risk factors [place] up in 3 months  Subjective:    The patient is accompanied by her husband who supplements the history.    How long did patient have memory difficulties?  Husband says it is for the last 10 months to 1 year. She denies any issues, "everyone is forgetful".  Her husband reports that she has trouble with remembering how to perform certain tasks, for example recently she could not figure out how to use ATM cards, the remote, the washer and the thermostat.  Patient becomes very defensive, stating that is not true.  She has forgotten some people that recently saw.  repeats oneself?  Endorsed by family "over and over again", although patient denies.  There is some repetitive behavior, including purchasing items forgetting that she already bought them recently, then losing them.  Chart reports that  she bought an iPad and 2 apple watches and then lost them.  Disoriented when walking into a room?  Patient denies   Leaving objects in unusual  places?   Endorsed, she placed bottles of wine under the bed.  Wandering behavior? denies   Any personality changes since last visit?  She is easily irritable, defensive. Any history of depression?:  denies   Hallucinations or paranoia?  Family reports that she may be borderline paranoid, becoming very defensive when approached about her memory. Seizures? denies    Any sleep changes?  Denies vivid dreams, REM behavior or sleepwalking.  "I don't take a pill anymore ".  Sleep apnea? denies   Any hygiene concerns?  denies   Independent of bathing and dressing?  Endorsed  Does the patient need help with medications?  Husband fixes the meds and she is in charge   Who is in charge of the finances? Husband  is in charge because she overplayed them  (for the last 6 months)    Any changes in appetite?   denies     Patient have trouble swallowing?  denies   Does the patient cook?  Yes Any kitchen accidents such as leaving the stove on? Patient denies   Any headaches?  denies   Chronic back pain?  denies   Ambulates with difficulty? denies   Recent falls or head injuries? denies     Vision changes? Unilateral weakness, numbness or tingling?  denies   Any tremors?  denies   Any anosmia?  denies   Any incontinence of urine? denies   Any bowel dysfunction?    denies      Patient lives with husband   History of  heavy alcohol intake? Denies.  She states that the bottle may last 3 days. History of heavy tobacco use? denies   Family history of dementia?   Mother and sister, and, brother had Alzheimer's dementia.  Dose patient drive? Denies any issues, denies getting lost.  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, now she volunteers)   Allergies  Allergen Reactions   Other Other (See Comments)    Current Outpatient Medications  Medication Instructions   amLODipine (NORVASC)  5 MG tablet    Biotin 10 MG TABS 1 tablet   cholecalciferol 25 MCG (1000 UT) tablet    Cyanocobalamin (VITAMIN B 12) 100 MCG LOZG Daily   Omega-3 Fatty Acids (FISH OIL) 1000 MG CAPS 1 capsule   pravastatin (PRAVACHOL) 20 MG tablet    rosuvastatin (CRESTOR) 10 MG tablet    sertraline (ZOLOFT) 50 MG tablet    Turmeric 500 MG CAPS as directed Orally     VITALS:   Vitals:   04/07/22 0944  BP: (!) 140/78  Pulse: 67  Resp: 18  SpO2: 97%  Weight: 173 lb (78.5 kg)  Height: '5\' 2"'$  (1.575 m)       No data to display          PHYSICAL EXAM   HEENT:  Normocephalic, atraumatic. The mucous membranes are moist. The superficial temporal arteries are without ropiness or tenderness. Cardiovascular: Regular rate and rhythm. Lungs: Clear to auscultation bilaterally. Neck: There are no carotid bruits noted bilaterally.  NEUROLOGICAL:    04/07/2022   10:00 AM  Montreal Cognitive Assessment   Visuospatial/ Executive (0/5) 2  Naming (0/3) 1  Attention: Read list of digits (0/2) 2  Attention: Read list of letters (0/1) 1  Attention: Serial 7 subtraction starting at 100 (0/3) 0  Language: Repeat phrase (0/2) 2  Language : Fluency (0/1) 1  Abstraction (0/2) 0  Delayed Recall (0/5) 0  Orientation (0/6) 3  Total 12  Adjusted Score (based on education) 12        No data to display           Orientation:  Alert and oriented to person, not to place, oriented to month, but not to day of the week.  No aphasia or dysarthria. Fund of knowledge is reduced. Recent and remote memory impaired.  Attention and concentration are reduced.  Able to name objects 1/3 and able to repeat phrases. Delayed recall 0/5.  Of note, fluency was intact. Cranial nerves: There is good facial symmetry. Extraocular muscles are intact and visual fields are full to confrontational testing. Speech is fluent and clear. no tongue deviation. Hearing is intact to conversational tone. Tone: Tone is good  throughout. Sensation: Sensation is intact to light touch and pinprick throughout. Vibration is intact . There is no sensory dermatomal level identified. Coordination: The patient has no difficulty with RAM's or FNF bilaterally. Normal finger to nose  Motor: Strength is 5/5 in the bilateral upper and lower extremities. There is no pronator drift. There are no fasciculations noted. DTR's: Deep tendon reflexes are 2/4 at the bilateral biceps, triceps, brachioradialis, patella and achilles.  Plantar responses are downgoing bilaterally. Gait and Station: The patient is able to ambulate without difficulty.The patient is able to ambulate in a tandem fashion, able to stand in the Romberg position.     Thank you for allowing [place] the opportunity to  participate in the care of this nice patient. Please do not hesitate to contact us for any questions or concerns.   Total time spent on today's visit was 50 minutes dedicated to this patient today, preparing to see patient, examining the patient, ordering tests and/or medications and counseling the patient, documenting clinical information in the EHR or other health record, independently interpreting results and communicating results to the patient/family, discussing treatment and goals, answering patient's questions and coordinating care.  Cc:  Mayra Neer, MD  Sharene Butters 04/07/2022 10:35 AM

## 2022-04-09 LAB — VITAMIN B1: Vitamin B1 (Thiamine): 13 nmol/L (ref 8–30)

## 2022-04-10 NOTE — Progress Notes (Signed)
B1 is normal , thanks

## 2022-04-22 ENCOUNTER — Ambulatory Visit
Admission: RE | Admit: 2022-04-22 | Discharge: 2022-04-22 | Disposition: A | Discharge status: Home / Self Care | Payer: Medicare Other | Source: Ambulatory Visit | Attending: Family Medicine | Admitting: Family Medicine

## 2022-04-22 DIAGNOSIS — G9389 Other specified disorders of brain: Secondary | ICD-10-CM | POA: Diagnosis not present

## 2022-04-22 DIAGNOSIS — R4189 Other symptoms and signs involving cognitive functions and awareness: Secondary | ICD-10-CM

## 2022-04-22 DIAGNOSIS — R413 Other amnesia: Secondary | ICD-10-CM | POA: Diagnosis not present

## 2022-04-22 DIAGNOSIS — I6782 Cerebral ischemia: Secondary | ICD-10-CM | POA: Diagnosis not present

## 2022-04-29 ENCOUNTER — Telehealth: Payer: Self-pay | Admitting: Anesthesiology

## 2022-04-29 DIAGNOSIS — D329 Benign neoplasm of meninges, unspecified: Secondary | ICD-10-CM

## 2022-04-29 DIAGNOSIS — R413 Other amnesia: Secondary | ICD-10-CM

## 2022-04-29 NOTE — Telephone Encounter (Signed)
Jazmin from [facility] called per Dr Derrill Memo stating pt had an MRI on 3/27, the results are in and Dr Brigitte Pulse would like for those results to be reviewed by Clarise Cruz and inform pt of results.

## 2022-04-29 NOTE — Telephone Encounter (Signed)
Left message to call at 10:49 04/29/2022

## 2022-04-30 NOTE — Telephone Encounter (Signed)
Left message to call office at 04/30/2022 at 9:55

## 2022-04-30 NOTE — Telephone Encounter (Signed)
Patient called an advised of MRI results, thanked me for calling.

## 2022-05-04 ENCOUNTER — Telehealth: Payer: Self-pay | Admitting: Physician Assistant

## 2022-05-04 NOTE — Telephone Encounter (Signed)
Patient is returning a call. °

## 2022-05-04 NOTE — Telephone Encounter (Signed)
Pt called in and left a message with the access nurse on 05/02/22. She is returning a call about MRI results

## 2022-05-08 DIAGNOSIS — G939 Disorder of brain, unspecified: Secondary | ICD-10-CM | POA: Diagnosis not present

## 2022-05-08 DIAGNOSIS — E7800 Pure hypercholesterolemia, unspecified: Secondary | ICD-10-CM | POA: Diagnosis not present

## 2022-05-08 DIAGNOSIS — I10 Essential (primary) hypertension: Secondary | ICD-10-CM | POA: Diagnosis not present

## 2022-05-08 DIAGNOSIS — G309 Alzheimer's disease, unspecified: Secondary | ICD-10-CM | POA: Diagnosis not present

## 2022-05-08 DIAGNOSIS — R7301 Impaired fasting glucose: Secondary | ICD-10-CM | POA: Diagnosis not present

## 2022-05-08 NOTE — Telephone Encounter (Signed)
Pt called requesting MRI results °

## 2022-05-11 NOTE — Telephone Encounter (Signed)
No answer at :1:14pm :

## 2022-05-11 NOTE — Telephone Encounter (Signed)
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  I think this is likely incidental finding and not cause of memory concerns.  For better visualization, would like to check MRI of brain WITH contrast. Per Dr.Adam Everlena Cooper

## 2022-05-12 NOTE — Telephone Encounter (Signed)
Multiple messages left.

## 2022-05-25 ENCOUNTER — Ambulatory Visit
Admission: RE | Admit: 2022-05-25 | Discharge: 2022-05-25 | Disposition: A | Discharge status: Home / Self Care | Payer: Medicare Other | Source: Ambulatory Visit | Attending: Neurology | Admitting: Neurology

## 2022-05-25 DIAGNOSIS — R413 Other amnesia: Secondary | ICD-10-CM | POA: Diagnosis not present

## 2022-05-25 MED ORDER — GADOPICLENOL 0.5 MMOL/ML IV SOLN
8.0000 mL | Freq: Once | INTRAVENOUS | Status: AC | PRN
  Administered 2022-05-25: 8 mL via INTRAVENOUS

## 2022-05-29 ENCOUNTER — Telehealth: Payer: Self-pay

## 2022-05-29 NOTE — Progress Notes (Signed)
MRI brain shows some atrophy in the brain. Also there is a little growth outside of the brain that appears benign. Will  need to repeat the MRI of the brain to observe it on about 6 months, but is unlikely to be of significance. Thanks

## 2022-05-29 NOTE — Telephone Encounter (Signed)
Make MRI order for [place], [place]. [phone] today is May 3rd 2024.

## 2022-06-29 ENCOUNTER — Telehealth: Payer: Self-pay | Admitting: Physician Assistant

## 2022-06-29 NOTE — Telephone Encounter (Signed)
Spoke with patients POA

## 2022-06-29 NOTE — Telephone Encounter (Signed)
Pt husband stop by and would like the results of the MRI   Pt is going out of town tomorrow morning if you can't get he results today Please call  next week

## 2022-07-09 ENCOUNTER — Encounter: Payer: Self-pay | Admitting: Physician Assistant

## 2022-07-09 ENCOUNTER — Ambulatory Visit (INDEPENDENT_AMBULATORY_CARE_PROVIDER_SITE_OTHER): Payer: Medicare Other | Admitting: Physician Assistant

## 2022-07-09 VITALS — BP 140/80 | HR 70 | Resp 18 | Ht 62.0 in | Wt 167.0 lb

## 2022-07-09 DIAGNOSIS — F0393 Unspecified dementia, unspecified severity, with mood disturbance: Secondary | ICD-10-CM | POA: Diagnosis not present

## 2022-07-09 DIAGNOSIS — Z86011 Personal history of benign neoplasm of the brain: Secondary | ICD-10-CM | POA: Diagnosis not present

## 2022-07-09 MED ORDER — DONEPEZIL HCL 10 MG PO TABS: ORAL_TABLET | ORAL | 3 refills | Status: DC

## 2022-07-09 NOTE — Patient Instructions (Addendum)
It was a pleasure to see you today at our office.   Recommendations:  Repeat MRI of the brain in 1 year   Continue Donepezil 10 mg daily.   Continue to replenish B1  Recommend good control of cardiovascular risk factors.   Continue to control mood as per PCP Follow in 6 months    For assessment of decision of mental capacity and competency:  Call Dr. Erick Blinks, geriatric psychiatrist at [phone]   Whom to call:  Memory  decline, memory medications: Call our office [phone]   For psychiatric meds, mood meds: Please have your primary care physician manage these medications.    If you have any severe symptoms of a stroke, or other severe issues such as confusion,severe chills or fever, etc call 911 or go to the ER as you may need to be evaluated further       RECOMMENDATIONS FOR ALL PATIENTS WITH MEMORY PROBLEMS: 1. Continue to exercise (Recommend 30 minutes of walking everyday, or 3 hours every week) 2. Increase social interactions - continue going to [place] and enjoy social gatherings with friends and family 3. Eat healthy, avoid fried foods and eat more fruits and vegetables 4. Maintain adequate blood pressure, blood sugar, and blood cholesterol level. Reducing the risk of stroke and cardiovascular disease also helps promoting better memory. 5. Avoid stressful situations. Live a simple life and avoid aggravations. Organize your time and prepare for the next day in anticipation. 6. Sleep well, avoid any interruptions of sleep and avoid any distractions in the bedroom that may interfere with adequate sleep quality 7. Avoid sugar, avoid sweets as there is a strong link between excessive sugar intake, diabetes, and cognitive impairment We discussed the Mediterranean diet, which has been shown to help patients reduce the risk of progressive memory disorders and reduces cardiovascular risk. This includes eating fish, eat fruits and green leafy vegetables, nuts like almonds  and hazelnuts, walnuts, and also use olive oil. Avoid fast foods and fried foods as much as possible. Avoid sweets and sugar as sugar use has been linked to worsening of memory function.  There is always a concern of gradual progression of memory problems. If this is the case, then we may need to adjust level of care according to patient needs. Support, both to the patient and caregiver, should then be put into place.        FALL PRECAUTIONS: Be cautious when walking. Scan the area for obstacles that may increase the risk of trips and falls. When getting up in the mornings, sit up at the edge of the bed for a few minutes before getting out of bed. Consider elevating the bed at the head end to avoid drop of blood pressure when getting up. Walk always in a well-lit room (use night lights in the walls). Avoid area rugs or power cords from appliances in the middle of the walkways. Use a walker or a cane if necessary and consider physical therapy for balance exercise. Get your eyesight checked regularly.  FINANCIAL OVERSIGHT: Supervision, especially oversight when making financial decisions or transactions is also recommended.  HOME SAFETY: Consider the safety of the kitchen when operating appliances like stoves, microwave oven, and blender. Consider having supervision and share cooking responsibilities until no longer able to participate in those. Accidents with firearms and other hazards in the house should be identified and addressed as well.   ABILITY TO BE LEFT ALONE: If patient is unable to contact 911 operator, consider using LifeLine,  or when the need is there, arrange for someone to stay with patients. Smoking is a fire hazard, consider supervision or cessation. Risk of wandering should be assessed by caregiver and if detected at any point, supervision and safe proof recommendations should be instituted.  MEDICATION SUPERVISION: Inability to self-administer medication needs to be constantly  addressed. Implement a mechanism to ensure safe administration of the medications.   DRIVING: Regarding driving, in patients with progressive memory problems, driving will be impaired. We advise to have someone else do the driving if trouble finding directions or if minor accidents are reported. Independent driving assessment is available to determine safety of driving.   If you are interested in the driving assessment, you can contact the following:  [facility] [employer] [facility] in [place] [phone]  [facility] [phone]  [facility] [phone] or [phone]    Mediterranean Diet A Mediterranean diet refers to food and lifestyle choices that are based on the traditions of countries located on the [employer]. This way of eating has been shown to help prevent certain conditions and improve outcomes for people who have chronic diseases, like kidney disease and heart disease. What are tips for following this plan? Lifestyle  Cook and eat meals together with your family, when possible. Drink enough fluid to keep your urine clear or pale yellow. Be physically active every day. This includes: Aerobic exercise like running or swimming. Leisure activities like gardening, walking, or housework. Get 7-8 hours of sleep each night. If recommended by your health care provider, drink red wine in moderation. This means 1 glass a day for nonpregnant women and 2 glasses a day for men. A glass of wine equals 5 oz (150 mL). Reading food labels  Check the serving size of packaged foods. For foods such as rice and pasta, the serving size refers to the amount of cooked product, not dry. Check the total fat in packaged foods. Avoid foods that have saturated fat or trans fats. Check the ingredients list for added sugars, such as corn syrup. Shopping  At the grocery store, buy most of your food from the areas near the walls of the  store. This includes: Fresh fruits and vegetables (produce). Grains, beans, nuts, and seeds. Some of these may be available in unpackaged forms or large amounts (in bulk). Fresh seafood. Poultry and eggs. Low-fat dairy products. Buy whole ingredients instead of prepackaged foods. Buy fresh fruits and vegetables in-season from local farmers markets. Buy frozen fruits and vegetables in resealable bags. If you do not have access to quality fresh seafood, buy precooked frozen shrimp or canned fish, such as tuna, salmon, or sardines. Buy small amounts of raw or cooked vegetables, salads, or olives from the deli or salad bar at your store. Stock your pantry so you always have certain foods on hand, such as olive oil, canned tuna, canned tomatoes, rice, pasta, and beans. Cooking  Cook foods with extra-virgin olive oil instead of using butter or other vegetable oils. Have meat as a side dish, and have vegetables or grains as your main dish. This means having meat in small portions or adding small amounts of meat to foods like pasta or stew. Use beans or vegetables instead of meat in common dishes like chili or lasagna. Experiment with different cooking methods. Try roasting or broiling vegetables instead of steaming or sauteing them. Add frozen vegetables to soups, stews, pasta, or rice. Add nuts or seeds for added healthy fat at each  meal. You can add these to yogurt, salads, or vegetable dishes. Marinate fish or vegetables using olive oil, lemon juice, garlic, and fresh herbs. Meal planning  Plan to eat 1 vegetarian meal one day each week. Try to work up to 2 vegetarian meals, if possible. Eat seafood 2 or more times a week. Have healthy snacks readily available, such as: Vegetable sticks with hummus. Greek yogurt. Fruit and nut trail mix. Eat balanced meals throughout the week. This includes: Fruit: 2-3 servings a day Vegetables: 4-5 servings a day Low-fat dairy: 2 servings a day Fish,  poultry, or lean meat: 1 serving a day Beans and legumes: 2 or more servings a week Nuts and seeds: 1-2 servings a day Whole grains: 6-8 servings a day Extra-virgin olive oil: 3-4 servings a day Limit red meat and sweets to only a few servings a month What are my food choices? Mediterranean diet Recommended Grains: Whole-grain pasta. Brown rice. Bulgar wheat. Polenta. Couscous. Whole-wheat bread. Orpah Cobb. Vegetables: Artichokes. Beets. Broccoli. Cabbage. Carrots. Eggplant. Green beans. Chard. Kale. Spinach. Onions. Leeks. Peas. Squash. Tomatoes. Peppers. Radishes. Fruits: Apples. Apricots. Avocado. Berries. Bananas. Cherries. Dates. Figs. Grapes. Lemons. Melon. Oranges. Peaches. Plums. Pomegranate. Meats and other protein foods: Beans. Almonds. Sunflower seeds. Pine nuts. Peanuts. Cod. Salmon. Scallops. Shrimp. Tuna. Tilapia. Clams. Oysters. Eggs. Dairy: Low-fat milk. Cheese. Greek yogurt. Beverages: Water. Red wine. Herbal tea. Fats and oils: Extra virgin olive oil. Avocado oil. Grape seed oil. Sweets and desserts: [place] yogurt with honey. Baked apples. Poached pears. Trail mix. Seasoning and other foods: Basil. Cilantro. Coriander. Cumin. Mint. Parsley. Sage. Rosemary. Tarragon. Garlic. Oregano. Thyme. Pepper. Balsalmic vinegar. Tahini. Hummus. Tomato sauce. Olives. Mushrooms. Limit these Grains: Prepackaged pasta or rice dishes. Prepackaged cereal with added sugar. Vegetables: Deep fried potatoes (french fries). Fruits: Fruit canned in syrup. Meats and other protein foods: Beef. Pork. Lamb. Poultry with skin. Hot dogs. Tomasa Blase. Dairy: Ice cream. Sour cream. Whole milk. Beverages: Juice. Sugar-sweetened soft drinks. Beer. Liquor and spirits. Fats and oils: Butter. Canola oil. Vegetable oil. Beef fat (tallow). Lard. Sweets and desserts: Cookies. Cakes. Pies. Candy. Seasoning and other foods: Mayonnaise. Premade sauces and marinades. The items listed may not be a complete list. Talk  with your dietitian about what dietary choices are right for you. Summary The Mediterranean diet includes both food and lifestyle choices. Eat a variety of fresh fruits and vegetables, beans, nuts, seeds, and whole grains. Limit the amount of red meat and sweets that you eat. Talk with your health care provider about whether it is safe for you to drink red wine in moderation. This means 1 glass a day for nonpregnant women and 2 glasses a day for men. A glass of wine equals 5 oz (150 mL). This information is not intended to replace advice given to you by your health care provider. Make sure you discuss any questions you have with your health care provider. Document Released: 09/05/2015 Document Revised: 10/08/2015 Document Reviewed: 09/05/2015 Elsevier Interactive Patient Education  2017 [employer].

## 2022-07-09 NOTE — Progress Notes (Signed)
Assessment/Plan:   Memory Impairment with behavioral disturbance  Rebecca Gross is a very pleasant 77 y.o. RH female  with a history of hypertension, hyperlipidemia, abnormal fasting glucose, osteopenia seen today in follow up for memory loss. Patient is currently on donepezil 10 mg daily. MRI brain with and without contrast personally reviewed was remarkable for bilateral anterior temporal atrophy, mild to moderate chronic small vessel ischemia in the setting white matter as well as a possible meningioma or benign bony lesion measuring 14 mm.  No visible brain edema or worrisome mass effect was noted. Memory is stable, although findings are concerning dementia, unclear etiology.  Able to perform ADLS. Continues to drive.  Patient has discontinued alcohol intake.       Follow up in  6 months. Continue donepezil 10 mg daily, side effects discussed Continue B1 and B12 replenishment.  Repeat MRI brain in 1 year to follow up on possible meningioma  Monitor driving. Recommend good control of her cardiovascular risk factors.  Continue to control mood as per PCP, patient is on Zoloft recently increased to 100 mg daily.     Subjective:    This patient is accompanied in the office by her husband who supplements the history.  Previous records as well as any outside records available were reviewed prior to todays visit. Patient was last seen on 04/07/2022 with a MoCA of 12/20    Any changes in memory since last visit?  She continues to have trouble remembering how to do certain tasks such as how to use an ATM card or remote,or  operating her thermostat.  Patient has some difficulty remembering recent conversations and people 's names. 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  repeats oneself?  Endorsed "not getting better"-husband says Disoriented when walking into a room?  Patient denies   Leaving objects in unusual places?  Endorsed.  In the past she placed bottles of wine under the bed  but not recently.  Wandering behavior?  denies   Any personality changes since last visit?  Endorsed by her husband.  She may be  very irritable and defensive, recently her Zoloft has been increased.  Any worsening depression?:  denies   Hallucinations or paranoia?  denies   Seizures?  denies    Any sleep changes?  Denies vivid dreams, REM behavior or sleepwalking   Sleep apnea?   denies   Any hygiene concerns?  denies   Independent of bathing and dressing?  Endorsed  Does the patient needs help with medications?  Husband prepares the pillbox Who is in charge of the finances?  Has been is in charge because in the past she overpaid. Most bills on auto pay now Any changes in appetite?  Denies." She eats a lot of sweets"- husband says.  Patient have trouble swallowing?  denies   Does the patient cook? Husband does most of the cooking.   Any kitchen accidents such as leaving the stove on? Patient denies   Any headaches?   denies   Chronic back pain  denies   Ambulates with difficulty?  Walks around the neighborhood and on the treadmill.    Recent falls or head injuries? denies     Unilateral weakness, numbness or tingling?    denies   Any tremors?  denies   Any anosmia?  Patient denies   Any incontinence of urine?  denies   Any bowel dysfunction?     denies      Patient lives  with husband  Does the patient drive? Yes, up to [facility] . 1 month ago she got lost, without recurrence.  Alcohol? Stopped drinking  Initial visit 04/07/2022 How long did patient have memory difficulties?  Husband says it is for the last 10 months to 1 year. She denies any issues, "everyone is forgetful".  Her husband reports that she has trouble with remembering how to perform certain tasks, for example recently she could not figure out how to use ATM cards, the remote, the washer and the thermostat.  Patient becomes very defensive, stating that is not true.  She has forgotten some people that recently saw.   repeats oneself?  Endorsed by family "over and over again", although patient denies.  There is some repetitive behavior, including purchasing items forgetting that she already bought them recently, then losing them.  Chart reports that  she bought an iPad and 2 apple watches and then lost them.  Disoriented when walking into a room?  Patient denies   Leaving objects in unusual places?   Endorsed, she placed bottles of wine under the bed.  Wandering behavior? denies   Any personality changes since last visit?  She is easily irritable, defensive. Any history of depression?:  denies   Hallucinations or paranoia?  Family reports that she may be borderline paranoid, becoming very defensive when approached about her memory. Seizures? denies    Any sleep changes?  Denies vivid dreams, REM behavior or sleepwalking.  "I don't take a pill anymore ".  Sleep apnea? denies   Any hygiene concerns?  denies   Independent of bathing and dressing?  Endorsed  Does the patient need help with medications?  Husband fixes the meds and she is in charge   Who is in charge of the finances? Husband  is in charge because she overpaid them  (for the last 6 months)    Any changes in appetite?   denies     Patient have trouble swallowing?  denies   Does the patient cook?  Yes Any kitchen accidents such as leaving the stove on? Patient denies   Any headaches?  denies   Chronic back pain?  denies   Ambulates with difficulty? denies   Recent falls or head injuries? denies     Vision changes? Unilateral weakness, numbness or tingling?  denies   Any tremors?  denies   Any anosmia?  denies   Any incontinence of urine? denies   Any bowel dysfunction?    denies      Patient lives with husband   History of heavy alcohol intake? Denies.  She states that the bottle may last 3 days. History of heavy tobacco use? denies   Family history of dementia?   Mother and sister, and, brother had Alzheimer's dementia.  Dose patient  drive? Denies any issues, denies getting lost.   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   Omega-3 Fatty Acids (FISH OIL) 1000 MG CAPS 1 capsule   pravastatin (PRAVACHOL) 20 MG tablet    rosuvastatin (CRESTOR) 10 MG tablet  sertraline (ZOLOFT) 50 MG tablet    Turmeric 500 MG CAPS as directed Orally   [DISCONTINUED] donepezil (ARICEPT) 10 MG tablet Take half tablet (5 mg) daily for 2 weeks, then 1 tablet 10 mg daily.   donepezil (ARICEPT) 10 MG tablet Take  1 tablet 10 mg daily.   No facility-administered encounter medications on file as of 07/09/2022.        No data to display            04/07/2022   10:00 AM  Montreal Cognitive Assessment   Visuospatial/ Executive (0/5) 2  Naming (0/3) 1  Attention: Read list of digits (0/2) 2  Attention: Read list of letters (0/1) 1  Attention: Serial 7 subtraction starting at 100 (0/3) 0  Language: Repeat phrase (0/2) 2  Language : Fluency (0/1) 1  Abstraction (0/2) 0  Delayed Recall (0/5) 0  Orientation (0/6) 3  Total 12  Adjusted Score (based on education) 12    Objective:     PHYSICAL EXAMINATION:    VITALS:   Vitals:   07/09/22 1250 07/09/22 1342  BP: (!) 217/93 (!) 140/80  Pulse: 70   Resp: 18   SpO2: 98%   Weight: 167 lb (75.8 kg)   Height: 5\' 2"  (1.575 m)     GEN:  The patient appears stated age and is in NAD. HEENT:  Normocephalic, atraumatic.   Neurological examination:  General: NAD, well-groomed, appears stated age. Orientation: The patient is alert. Oriented to person, not to place and  date Cranial nerves: There is good facial symmetry.The speech is fluent and clear. No aphasia or dysarthria. Fund of knowledge is reduced. Recent and remote memory are impaired. Attention and concentration are reduced.  Able to name objects and repeat phrases.  Hearing is intact to conversational tone.   Sensation: Sensation is intact to light touch throughout Motor: Strength is at least antigravity x4. DTR's 2/4 in UE/LE     Movement examination: Tone: There is normal tone in the UE/LE Abnormal movements:  no tremor.  No myoclonus.  No asterixis.   Coordination:  There is no decremation with RAM's. Normal FNT Gait and Station: The patient has no difficulty arising out of a deep-seated chair without the use of the hands. The patient's stride length is good.  Gait is cautious and narrow.    Thank you for allowing [place] the opportunity to participate in the care of this nice patient. Please do not hesitate to contact us for any questions or concerns.   Total time spent on today's visit was 37 minutes dedicated to this patient today, preparing to see patient, examining the patient, ordering tests and/or medications and counseling the patient, documenting clinical information in the EHR or other health record, independently interpreting results and communicating results to the patient/family, discussing treatment and goals, answering patient's questions and coordinating care.  Cc:  Lupita Raider, MD  Marlowe Kays 07/09/2022 1:47 PM

## 2022-08-06 DIAGNOSIS — Z1231 Encounter for screening mammogram for malignant neoplasm of breast: Secondary | ICD-10-CM | POA: Diagnosis not present

## 2022-08-28 ENCOUNTER — Other Ambulatory Visit: Payer: Medicare Other

## 2022-09-03 ENCOUNTER — Other Ambulatory Visit: Payer: Medicare Other

## 2022-09-10 DIAGNOSIS — M8588 Other specified disorders of bone density and structure, other site: Secondary | ICD-10-CM | POA: Diagnosis not present

## 2022-09-24 ENCOUNTER — Encounter: Payer: Self-pay | Admitting: Physician Assistant

## 2022-09-24 ENCOUNTER — Ambulatory Visit
Admission: RE | Admit: 2022-09-24 | Discharge: 2022-09-24 | Disposition: A | Discharge status: Home / Self Care | Payer: Medicare Other | Source: Ambulatory Visit | Attending: Physician Assistant | Admitting: Physician Assistant

## 2022-09-24 DIAGNOSIS — Z86011 Personal history of benign neoplasm of the brain: Secondary | ICD-10-CM | POA: Diagnosis not present

## 2022-09-24 MED ORDER — GADOPICLENOL 0.5 MMOL/ML IV SOLN
7.5000 mL | Freq: Once | INTRAVENOUS | Status: AC | PRN
  Administered 2022-09-24: 7.5 mL via INTRAVENOUS

## 2022-10-11 NOTE — Progress Notes (Signed)
MRI of the brain confirms the meningioma, which looks benign unchanged from prior MRI from April 2024.   Will repeat the MRI in 1 year per protocol, to make sure that he did not grow.  No other findings, no new additional masses or fluid or bleeding.  See you as scheduled.

## 2022-10-18 NOTE — Progress Notes (Signed)
Images show the little growth outside of the brain (meningioma) on MRI is  unchanged from the MRIperformend  in April 2024. Also seen, stable age related changes in the shape, size and circulation.   No acute findings

## 2022-10-19 NOTE — Progress Notes (Signed)
No answer at 1;53 10/19/2022

## 2022-10-20 NOTE — Progress Notes (Signed)
Left message to call office for results.

## 2022-10-27 ENCOUNTER — Ambulatory Visit
Admission: RE | Admit: 2022-10-27 | Discharge: 2022-10-27 | Disposition: A | Discharge status: Home / Self Care | Payer: Medicare Other | Source: Ambulatory Visit | Attending: Physician Assistant | Admitting: Physician Assistant

## 2022-10-27 DIAGNOSIS — Z86011 Personal history of benign neoplasm of the brain: Secondary | ICD-10-CM

## 2022-10-27 DIAGNOSIS — R413 Other amnesia: Secondary | ICD-10-CM | POA: Diagnosis not present

## 2022-10-27 MED ORDER — GADOPICLENOL 0.5 MMOL/ML IV SOLN
8.0000 mL | Freq: Once | INTRAVENOUS | Status: AC | PRN
  Administered 2022-10-27: 8 mL via INTRAVENOUS

## 2022-11-06 DIAGNOSIS — G309 Alzheimer's disease, unspecified: Secondary | ICD-10-CM | POA: Diagnosis not present

## 2022-11-06 DIAGNOSIS — Z0000 Encounter for general adult medical examination without abnormal findings: Secondary | ICD-10-CM | POA: Diagnosis not present

## 2022-11-06 DIAGNOSIS — E7800 Pure hypercholesterolemia, unspecified: Secondary | ICD-10-CM | POA: Diagnosis not present

## 2022-11-06 DIAGNOSIS — Z23 Encounter for immunization: Secondary | ICD-10-CM | POA: Diagnosis not present

## 2022-11-06 DIAGNOSIS — R7301 Impaired fasting glucose: Secondary | ICD-10-CM | POA: Diagnosis not present

## 2022-11-06 DIAGNOSIS — M8580 Other specified disorders of bone density and structure, unspecified site: Secondary | ICD-10-CM | POA: Diagnosis not present

## 2022-11-06 DIAGNOSIS — G319 Degenerative disease of nervous system, unspecified: Secondary | ICD-10-CM | POA: Diagnosis not present

## 2022-11-06 DIAGNOSIS — E669 Obesity, unspecified: Secondary | ICD-10-CM | POA: Diagnosis not present

## 2022-11-06 DIAGNOSIS — I10 Essential (primary) hypertension: Secondary | ICD-10-CM | POA: Diagnosis not present

## 2022-11-19 ENCOUNTER — Telehealth: Payer: Self-pay | Admitting: Physician Assistant

## 2022-11-19 NOTE — Telephone Encounter (Signed)
Patient was calling about Lab results ?

## 2022-11-26 NOTE — Telephone Encounter (Signed)
I left detailed message on machine, theses results were already advised to patient. To call back if any questions.

## 2022-11-26 NOTE — Telephone Encounter (Signed)
Pt called in and left a message with the access nurse. She is calling about results

## 2023-01-08 ENCOUNTER — Ambulatory Visit: Payer: Medicare Other | Admitting: Physician Assistant

## 2023-01-11 ENCOUNTER — Ambulatory Visit (INDEPENDENT_AMBULATORY_CARE_PROVIDER_SITE_OTHER): Payer: Medicare Other | Admitting: Physician Assistant

## 2023-01-11 ENCOUNTER — Encounter: Payer: Self-pay | Admitting: Physician Assistant

## 2023-01-11 VITALS — BP 175/98 | HR 67 | Ht 62.0 in | Wt 176.0 lb

## 2023-01-11 DIAGNOSIS — Z86011 Personal history of benign neoplasm of the brain: Secondary | ICD-10-CM

## 2023-01-11 DIAGNOSIS — F0393 Unspecified dementia, unspecified severity, with mood disturbance: Secondary | ICD-10-CM | POA: Diagnosis not present

## 2023-01-11 NOTE — Progress Notes (Signed)
Assessment/Plan:   Memory impairment with mood disturbance.  Rebecca Gross is a very pleasant 77 y.o. RH female with a history of hypertension, hyperlipidemia, abnormal fasting glucose, osteopenia seen today in follow up for memory loss. Patient is currently on donepezil 10 mg daily . Patient is able to participate on his ADLs and to drive without difficulties Most recent MRI brain 10/2022 personally reviewed   remarkable for moderate chronic small-vessel ischemic changes of the deep and subcortical white matter of both cerebral hemispheres, not visibly progressive since the prior exams and the known 14 x 16 mm heavily calcified meningioma at the lateral floor of the anterior cranial fossa on the right, unchanged with slight indentation upon the brain but no evidence of edema or brain gliosis. MMSE today is 23/30, stable performance-wise. Discussed adding memantine in the future, husband to call us. Patient is able to participate on her ADLs and to drive without difficulties.     Follow up in 6  months. Continue donepezil 10 mg daily. Side effects were discussed  Continue B12 supplements Repeat MRI brain in 12 months for meningioma follow up Recommend good control of her cardiovascular risk factors. Patient informed of elevated BP today Continue to control mood as per PCP, she is on Zoloft Monitor driving    Subjective:    This patient is accompanied in the office by her husband who supplements the history.  Previous records as well as any outside records available were reviewed prior to todays visit. Patient was last seen on 07/09/22    Any changes in memory since last visit? "About the same"-but husband reports that memory may be worse .Patient has some difficulty remembering recent conversations and people names.  She continue having trouble operating instruments such as the remote, cell, how to use the ATM or operating the thermostat. She continues to volunteer at [employer]  oneself?  Endorsed.  Disoriented when walking into a room?  Patient denies   Leaving objects?  May misplace things more often such as the phone under the milk carton.   Wandering behavior?  Denies.   Any personality changes since last visit?  She may become irritable quicker than before, she is on Zoloft  Any worsening depression?:  Denies.   Hallucinations or paranoia?  May ask about deceased relatives. No paranoia.  Seizures? denies .   Any sleep changes?  Sleeps well. Occasionally may take Z-quil. Denies vivid dreams, REM behavior or sleepwalking   Sleep apnea?   Denies.   Any hygiene concerns? Denies.  Independent of bathing and dressing?  Endorsed  Does the patient needs help with medications? Husband  is in charge  Who is in charge of the finances? Husband  is in charge, but most bills are on autopay    Any changes in appetite?  Eats well. Loves sugar frosted flakes and key lime pie and bananas   Patient have trouble swallowing? Denies.   Does the patient cook? No, husband does most of the cooking Any headaches?   Denies.   Chronic back pain  denies.   Ambulates with difficulty? Denies. Walks around the neighborhood and on the treadmill.    Recent falls or head injuries? denies     Unilateral weakness, numbness or tingling? Denies.   Any tremors?  Denies   Any anosmia?  Denies   Any incontinence of urine? Denies  Any bowel dysfunction?   Denies      Patient lives with  husband Does the patient drive?  Short distances.     MRI brain with and without contrast personally reviewed was remarkable for bilateral anterior temporal atrophy, mild to moderate chronic small vessel ischemia in the setting white matter as well as a possible meningioma or benign bony lesion measuring 14 mm.  No visible brain edema or worrisome mass effect was noted      Initial visit 04/07/2022 How long did patient have memory difficulties?  Husband says it is for the last 10 months to 1 year. She denies any  issues, "everyone is forgetful".  Her husband reports that she has trouble with remembering how to perform certain tasks, for example recently she could not figure out how to use ATM cards, the remote, the washer and the thermostat.  Patient becomes very defensive, stating that is not true.  She has forgotten some people that recently saw.  repeats oneself?  Endorsed by family "over and over again", although patient denies.  There is some repetitive behavior, including purchasing items forgetting that she already bought them recently, then losing them.  Chart reports that  she bought an iPad and 2 apple watches and then lost them.  Disoriented when walking into a room?  Patient denies   Leaving objects in unusual places?   Endorsed, she placed bottles of wine under the bed.  Wandering behavior? denies   Any personality changes since last visit?  She is easily irritable, defensive. Any history of depression?:  denies   Hallucinations or paranoia?  Family reports that she may be borderline paranoid, becoming very defensive when approached about her memory. Seizures? denies    Any sleep changes?  Denies vivid dreams, REM behavior or sleepwalking.  "I don't take a pill anymore ".  Sleep apnea? denies   Any hygiene concerns?  denies   Independent of bathing and dressing?  Endorsed  Does the patient need help with medications?  Husband fixes the meds and she is in charge   Who is in charge of the finances? Husband  is in charge because she overpaid them  (for the last 6 months)    Any changes in appetite?   denies     Patient have trouble swallowing?  denies   Does the patient cook?  Yes Any kitchen accidents such as leaving the stove on? Patient denies   Any headaches?  denies   Chronic back pain?  denies   Ambulates with difficulty? denies   Recent falls or head injuries? denies     Vision changes? Unilateral weakness, numbness or tingling?  denies   Any tremors?  denies   Any anosmia?  denies    Any incontinence of urine? denies   Any bowel dysfunction?    denies      Patient lives with husband   History of heavy alcohol intake? Denies.  She states that the bottle may last 3 days. History of heavy tobacco use? denies   Family history of dementia?   Mother and sister, and, brother had Alzheimer's dementia.  Dose patient drive? 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   donepezil (ARICEPT) 10 MG tablet Take  1 tablet 10 mg daily.   pravastatin (PRAVACHOL) 20 MG tablet    rosuvastatin (CRESTOR) 10 MG tablet    sertraline (ZOLOFT) 50 MG tablet    Turmeric 500 MG CAPS as directed Orally   [DISCONTINUED] Biotin 10 MG TABS 1 tablet   [DISCONTINUED] Omega-3 Fatty Acids (FISH OIL) 1000 MG CAPS 1 capsule   No facility-administered encounter medications on file as of 01/11/2023.       01/11/2023    1:00 PM  MMSE - Mini Mental State Exam  Orientation to time 4  Orientation to Place 3  Registration 3  Attention/ Calculation 5  Recall 0  Language- name 2 objects 2  Language- repeat 1  Language- follow 3 step command 3  Language- read & follow direction 1  Write a sentence 1  Copy design 0  Total score 23      04/07/2022   10:00 AM  Montreal Cognitive Assessment   Visuospatial/ Executive (0/5) 2  Naming (0/3) 1  Attention: Read list of digits (0/2) 2  Attention: Read list of letters (0/1) 1  Attention: Serial 7 subtraction starting at 100 (0/3) 0  Language: Repeat phrase (0/2) 2  Language : Fluency (0/1) 1  Abstraction (0/2) 0  Delayed Recall  (0/5) 0  Orientation (0/6) 3  Total 12  Adjusted Score (based on education) 12    Objective:     PHYSICAL EXAMINATION:    VITALS:   Vitals:   01/11/23 1259  BP: (!) 195/90  Pulse: 67  SpO2: 97%  Weight: 176 lb (79.8 kg)  Height: 5\' 2"  (1.575 m)    GEN:  The patient appears stated age and is in NAD. HEENT:  Normocephalic, atraumatic.   Neurological examination:  General: NAD, well-groomed, appears stated age. Orientation: The patient is alert. Oriented to person, place and date Cranial nerves: There is good facial symmetry.The speech is fluent and clear. No aphasia or dysarthria. Fund of knowledge is appropriate. Recent and remote memory are impaired. Attention and concentration are reduced.  Able to name objects and repeat phrases.  Hearing is intact to conversational tone.  Sensation: Sensation is intact to light touch throughout Motor: Strength is at least antigravity x4. DTR's 2/4 in UE/LE     Movement examination: Tone: There is normal tone in the UE/LE Abnormal movements:  no tremor.  No myoclonus.  No asterixis.   Coordination:  There is no decremation with RAM's. Normal finger to nose  Gait and Station: The patient has no difficulty arising out of a deep-seated chair without the use of the hands. The patient's stride length is good.  Gait is cautious and narrow.    Thank you for allowing [place] the opportunity to participate in the care of this nice patient. Please do not hesitate to contact us for any questions or concerns.   Total time spent on today's visit was 40 minutes dedicated to this patient today, preparing to see patient, examining the patient, ordering tests and/or medications and counseling the patient, documenting clinical information in the EHR or other health record, independently interpreting results and communicating results to the patient/family, discussing treatment and goals, answering patient's questions and coordinating care.  Cc:  Lupita Raider, MD  Marlowe Kays 01/11/2023 1:37 PM

## 2023-01-11 NOTE — Patient Instructions (Signed)
It was a pleasure to see you today at our office.   Recommendations:  Repeat MRI of the brain in 1 year   Continue Donepezil 10 mg daily.   Recommend good control of cardiovascular risk factors.   Continue to control mood as per PCP Follow in 6 months    For assessment of decision of mental capacity and competency:  Call Dr. Erick Blinks, geriatric psychiatrist at [phone]   Whom to call:  Memory  decline, memory medications: Call our office [phone]   For psychiatric meds, mood meds: Please have your primary care physician manage these medications.    If you have any severe symptoms of a stroke, or other severe issues such as confusion,severe chills or fever, etc call 911 or go to the ER as you may need to be evaluated further       RECOMMENDATIONS FOR ALL PATIENTS WITH MEMORY PROBLEMS: 1. Continue to exercise (Recommend 30 minutes of walking everyday, or 3 hours every week) 2. Increase social interactions - continue going to [place] and enjoy social gatherings with friends and family 3. Eat healthy, avoid fried foods and eat more fruits and vegetables 4. Maintain adequate blood pressure, blood sugar, and blood cholesterol level. Reducing the risk of stroke and cardiovascular disease also helps promoting better memory. 5. Avoid stressful situations. Live a simple life and avoid aggravations. Organize your time and prepare for the next day in anticipation. 6. Sleep well, avoid any interruptions of sleep and avoid any distractions in the bedroom that may interfere with adequate sleep quality 7. Avoid sugar, avoid sweets as there is a strong link between excessive sugar intake, diabetes, and cognitive impairment We discussed the Mediterranean diet, which has been shown to help patients reduce the risk of progressive memory disorders and reduces cardiovascular risk. This includes eating fish, eat fruits and green leafy vegetables, nuts like almonds and hazelnuts, walnuts, and  also use olive oil. Avoid fast foods and fried foods as much as possible. Avoid sweets and sugar as sugar use has been linked to worsening of memory function.  There is always a concern of gradual progression of memory problems. If this is the case, then we may need to adjust level of care according to patient needs. Support, both to the patient and caregiver, should then be put into place.        FALL PRECAUTIONS: Be cautious when walking. Scan the area for obstacles that may increase the risk of trips and falls. When getting up in the mornings, sit up at the edge of the bed for a few minutes before getting out of bed. Consider elevating the bed at the head end to avoid drop of blood pressure when getting up. Walk always in a well-lit room (use night lights in the walls). Avoid area rugs or power cords from appliances in the middle of the walkways. Use a walker or a cane if necessary and consider physical therapy for balance exercise. Get your eyesight checked regularly.  FINANCIAL OVERSIGHT: Supervision, especially oversight when making financial decisions or transactions is also recommended.  HOME SAFETY: Consider the safety of the kitchen when operating appliances like stoves, microwave oven, and blender. Consider having supervision and share cooking responsibilities until no longer able to participate in those. Accidents with firearms and other hazards in the house should be identified and addressed as well.   ABILITY TO BE LEFT ALONE: If patient is unable to contact 911 operator, consider using LifeLine, or when the need is  there, arrange for someone to stay with patients. Smoking is a fire hazard, consider supervision or cessation. Risk of wandering should be assessed by caregiver and if detected at any point, supervision and safe proof recommendations should be instituted.  MEDICATION SUPERVISION: Inability to self-administer medication needs to be constantly addressed. Implement a mechanism  to ensure safe administration of the medications.   DRIVING: Regarding driving, in patients with progressive memory problems, driving will be impaired. We advise to have someone else do the driving if trouble finding directions or if minor accidents are reported. Independent driving assessment is available to determine safety of driving.   If you are interested in the driving assessment, you can contact the following:  [employer] in [place] [phone]  [facility] [phone]  [facility] [phone] or [phone]    Mediterranean Diet A Mediterranean diet refers to food and lifestyle choices that are based on the traditions of countries located on the [employer] [place]. This way of eating has been shown to help prevent certain conditions and improve outcomes for people who have chronic diseases, like kidney disease and heart disease. What are tips for following this plan? Lifestyle  Cook and eat meals together with your family, when possible. Drink enough fluid to keep your urine clear or pale yellow. Be physically active every day. This includes: Aerobic exercise like running or swimming. Leisure activities like gardening, walking, or housework. Get 7-8 hours of sleep each night. If recommended by your health care provider, drink red wine in moderation. This means 1 glass a day for nonpregnant women and 2 glasses a day for men. A glass of wine equals 5 oz (150 mL). Reading food labels  Check the serving size of packaged foods. For foods such as rice and pasta, the serving size refers to the amount of cooked product, not dry. Check the total fat in packaged foods. Avoid foods that have saturated fat or trans fats. Check the ingredients list for added sugars, such as corn syrup. Shopping  At the grocery store, buy most of your food from the areas near the walls of the store. This includes: Fresh  fruits and vegetables (produce). Grains, beans, nuts, and seeds. Some of these may be available in unpackaged forms or large amounts (in bulk). Fresh seafood. Poultry and eggs. Low-fat dairy products. Buy whole ingredients instead of prepackaged foods. Buy fresh fruits and vegetables in-season from local farmers markets. Buy frozen fruits and vegetables in resealable bags. If you do not have access to quality fresh seafood, buy precooked frozen shrimp or canned fish, such as tuna, salmon, or sardines. Buy small amounts of raw or cooked vegetables, salads, or olives from the deli or salad bar at your store. Stock your pantry so you always have certain foods on hand, such as olive oil, canned tuna, canned tomatoes, rice, pasta, and beans. Cooking  Cook foods with extra-virgin olive oil instead of using butter or other vegetable oils. Have meat as a side dish, and have vegetables or grains as your main dish. This means having meat in small portions or adding small amounts of meat to foods like pasta or stew. Use beans or vegetables instead of meat in common dishes like chili or lasagna. Experiment with different cooking methods. Try roasting or broiling vegetables instead of steaming or sauteing them. Add frozen vegetables to soups, stews, pasta, or rice. Add nuts or seeds for added healthy fat at each meal. You can add these  to yogurt, salads, or vegetable dishes. Marinate fish or vegetables using olive oil, lemon juice, garlic, and fresh herbs. Meal planning  Plan to eat 1 vegetarian meal one day each week. Try to work up to 2 vegetarian meals, if possible. Eat seafood 2 or more times a week. Have healthy snacks readily available, such as: Vegetable sticks with hummus. Greek yogurt. Fruit and nut trail mix. Eat balanced meals throughout the week. This includes: Fruit: 2-3 servings a day Vegetables: 4-5 servings a day Low-fat dairy: 2 servings a day Fish, poultry, or lean meat: 1 serving  a day Beans and legumes: 2 or more servings a week Nuts and seeds: 1-2 servings a day Whole grains: 6-8 servings a day Extra-virgin olive oil: 3-4 servings a day Limit red meat and sweets to only a few servings a month What are my food choices? Mediterranean diet Recommended Grains: Whole-grain pasta. Brown rice. Bulgar wheat. Polenta. Couscous. Whole-wheat bread. Orpah Cobb. Vegetables: Artichokes. Beets. Broccoli. Cabbage. Carrots. Eggplant. Green beans. Chard. Kale. Spinach. Onions. Leeks. Peas. Squash. Tomatoes. Peppers. Radishes. Fruits: Apples. Apricots. Avocado. Berries. Bananas. Cherries. Dates. Figs. Grapes. Lemons. Melon. Oranges. Peaches. Plums. Pomegranate. Meats and other protein foods: Beans. Almonds. Sunflower seeds. Pine nuts. Peanuts. Cod. Salmon. Scallops. Shrimp. Tuna. Tilapia. Clams. Oysters. Eggs. Dairy: Low-fat milk. Cheese. Greek yogurt. Beverages: Water. Red wine. Herbal tea. Fats and oils: Extra virgin olive oil. Avocado oil. Grape seed oil. Sweets and desserts: [place] yogurt with honey. Baked apples. Poached pears. Trail mix. Seasoning and other foods: Basil. Cilantro. Coriander. Cumin. Mint. Parsley. Sage. Rosemary. Tarragon. Garlic. Oregano. Thyme. Pepper. Balsalmic vinegar. Tahini. Hummus. Tomato sauce. Olives. Mushrooms. Limit these Grains: Prepackaged pasta or rice dishes. Prepackaged cereal with added sugar. Vegetables: Deep fried potatoes (french fries). Fruits: Fruit canned in syrup. Meats and other protein foods: Beef. Pork. Lamb. Poultry with skin. Hot dogs. Tomasa Blase. Dairy: Ice cream. Sour cream. Whole milk. Beverages: Juice. Sugar-sweetened soft drinks. Beer. Liquor and spirits. Fats and oils: Butter. Canola oil. Vegetable oil. Beef fat (tallow). Lard. Sweets and desserts: Cookies. Cakes. Pies. Candy. Seasoning and other foods: Mayonnaise. Premade sauces and marinades. The items listed may not be a complete list. Talk with your dietitian about what  dietary choices are right for you. Summary The Mediterranean diet includes both food and lifestyle choices. Eat a variety of fresh fruits and vegetables, beans, nuts, seeds, and whole grains. Limit the amount of red meat and sweets that you eat. Talk with your health care provider about whether it is safe for you to drink red wine in moderation. This means 1 glass a day for nonpregnant women and 2 glasses a day for men. A glass of wine equals 5 oz (150 mL). This information is not intended to replace advice given to you by your health care provider. Make sure you discuss any questions you have with your health care provider. Document Released: 09/05/2015 Document Revised: 10/08/2015 Document Reviewed: 09/05/2015 Elsevier Interactive Patient Education  2017 [employer].

## 2023-02-10 DIAGNOSIS — I10 Essential (primary) hypertension: Secondary | ICD-10-CM | POA: Diagnosis not present

## 2023-02-10 DIAGNOSIS — G309 Alzheimer's disease, unspecified: Secondary | ICD-10-CM | POA: Diagnosis not present

## 2023-02-10 DIAGNOSIS — E7800 Pure hypercholesterolemia, unspecified: Secondary | ICD-10-CM | POA: Diagnosis not present

## 2023-02-10 DIAGNOSIS — R7301 Impaired fasting glucose: Secondary | ICD-10-CM | POA: Diagnosis not present

## 2023-05-12 DIAGNOSIS — Z1321 Encounter for screening for nutritional disorder: Secondary | ICD-10-CM | POA: Diagnosis not present

## 2023-05-12 DIAGNOSIS — K08409 Partial loss of teeth, unspecified cause, unspecified class: Secondary | ICD-10-CM | POA: Diagnosis not present

## 2023-05-12 DIAGNOSIS — Z01818 Encounter for other preprocedural examination: Secondary | ICD-10-CM | POA: Diagnosis not present

## 2023-05-12 DIAGNOSIS — G309 Alzheimer's disease, unspecified: Secondary | ICD-10-CM | POA: Diagnosis not present

## 2023-05-18 ENCOUNTER — Encounter: Payer: Self-pay | Admitting: Physician Assistant

## 2023-06-02 DIAGNOSIS — E669 Obesity, unspecified: Secondary | ICD-10-CM | POA: Diagnosis not present

## 2023-06-02 DIAGNOSIS — R7301 Impaired fasting glucose: Secondary | ICD-10-CM | POA: Diagnosis not present

## 2023-06-02 DIAGNOSIS — I10 Essential (primary) hypertension: Secondary | ICD-10-CM | POA: Diagnosis not present

## 2023-06-02 DIAGNOSIS — E7800 Pure hypercholesterolemia, unspecified: Secondary | ICD-10-CM | POA: Diagnosis not present

## 2023-06-02 DIAGNOSIS — G309 Alzheimer's disease, unspecified: Secondary | ICD-10-CM | POA: Diagnosis not present

## 2023-06-24 ENCOUNTER — Encounter: Payer: Self-pay | Admitting: Physician Assistant

## 2023-06-24 ENCOUNTER — Ambulatory Visit (INDEPENDENT_AMBULATORY_CARE_PROVIDER_SITE_OTHER): Admitting: Physician Assistant

## 2023-06-24 VITALS — BP 148/80 | HR 66 | Resp 20 | Ht 62.0 in

## 2023-06-24 DIAGNOSIS — F0393 Unspecified dementia, unspecified severity, with mood disturbance: Secondary | ICD-10-CM

## 2023-06-24 MED ORDER — MEMANTINE HCL 5 MG PO TABS: ORAL_TABLET | ORAL | 3 refills | Status: DC

## 2023-06-24 MED ORDER — DONEPEZIL HCL 10 MG PO TABS: ORAL_TABLET | ORAL | 3 refills | Status: AC

## 2023-06-24 NOTE — Patient Instructions (Signed)
 It was a pleasure to see you today at our office.   Recommendations:     Continue Donepezil  10 mg daily.    Start Memantine 5 mg: Take 1 tablet (5 mg at night) for 2 weeks, then increase to 1 tablet (5 mg) twice a day   Recommend good control of cardiovascular risk factors.   Continue to control mood as per PCP Follow in 6 months    For assessment of decision of mental capacity and competency:  Call Dr. Laverne Potter, geriatric psychiatrist at [phone]   Whom to call:  Memory  decline, memory medications: Call our office [phone]   For psychiatric meds, mood meds: Please have your primary care physician manage these medications.    If you have any severe symptoms of a stroke, or other severe issues such as confusion,severe chills or fever, etc call 911 or go to the ER as you may need to be evaluated further       RECOMMENDATIONS FOR ALL PATIENTS WITH MEMORY PROBLEMS: 1. Continue to exercise (Recommend 30 minutes of walking everyday, or 3 hours every week) 2. Increase social interactions - continue going to [place] and enjoy social gatherings with friends and family 3. Eat healthy, avoid fried foods and eat more fruits and vegetables 4. Maintain adequate blood pressure, blood sugar, and blood cholesterol level. Reducing the risk of stroke and cardiovascular disease also helps promoting better memory. 5. Avoid stressful situations. Live a simple life and avoid aggravations. Organize your time and prepare for the next day in anticipation. 6. Sleep well, avoid any interruptions of sleep and avoid any distractions in the bedroom that may interfere with adequate sleep quality 7. Avoid sugar, avoid sweets as there is a strong link between excessive sugar intake, diabetes, and cognitive impairment We discussed the Mediterranean diet, which has been shown to help patients reduce the risk of progressive memory disorders and reduces cardiovascular risk. This includes eating fish, eat  fruits and green leafy vegetables, nuts like almonds and hazelnuts, walnuts, and also use olive oil. Avoid fast foods and fried foods as much as possible. Avoid sweets and sugar as sugar use has been linked to worsening of memory function.  There is always a concern of gradual progression of memory problems. If this is the case, then we may need to adjust level of care according to patient needs. Support, both to the patient and caregiver, should then be put into place.        FALL PRECAUTIONS: Be cautious when walking. Scan the area for obstacles that may increase the risk of trips and falls. When getting up in the mornings, sit up at the edge of the bed for a few minutes before getting out of bed. Consider elevating the bed at the head end to avoid drop of blood pressure when getting up. Walk always in a well-lit room (use night lights in the walls). Avoid area rugs or power cords from appliances in the middle of the walkways. Use a walker or a cane if necessary and consider physical therapy for balance exercise. Get your eyesight checked regularly.  FINANCIAL OVERSIGHT: Supervision, especially oversight when making financial decisions or transactions is also recommended.  HOME SAFETY: Consider the safety of the kitchen when operating appliances like stoves, microwave oven, and blender. Consider having supervision and share cooking responsibilities until no longer able to participate in those. Accidents with firearms and other hazards in the house should be identified and addressed as well.   ABILITY  TO BE LEFT ALONE: If patient is unable to contact 911 operator, consider using LifeLine, or when the need is there, arrange for someone to stay with patients. Smoking is a fire hazard, consider supervision or cessation. Risk of wandering should be assessed by caregiver and if detected at any point, supervision and safe proof recommendations should be instituted.  MEDICATION SUPERVISION: Inability to  self-administer medication needs to be constantly addressed. Implement a mechanism to ensure safe administration of the medications.   DRIVING: Regarding driving, in patients with progressive memory problems, driving will be impaired. We advise to have someone else do the driving if trouble finding directions or if minor accidents are reported. Independent driving assessment is available to determine safety of driving.   If you are interested in the driving assessment, you can contact the following:  [employer] in [place] [phone]  [facility] [phone]  [facility] [phone] or [phone]    Mediterranean Diet A Mediterranean diet refers to food and lifestyle choices that are based on the traditions of countries located on the [place]. This way of eating has been shown to help prevent certain conditions and improve outcomes for people who have chronic diseases, like kidney disease and heart disease. What are tips for following this plan? Lifestyle  Cook and eat meals together with your family, when possible. Drink enough fluid to keep your urine clear or pale yellow. Be physically active every day. This includes: Aerobic exercise like running or swimming. Leisure activities like gardening, walking, or housework. Get 7-8 hours of sleep each night. If recommended by your health care provider, drink red wine in moderation. This means 1 glass a day for nonpregnant women and 2 glasses a day for men. A glass of wine equals 5 oz (150 mL). Reading food labels  Check the serving size of packaged foods. For foods such as rice and pasta, the serving size refers to the amount of cooked product, not dry. Check the total fat in packaged foods. Avoid foods that have saturated fat or trans fats. Check the ingredients list for added sugars, such as corn syrup. Shopping  At the grocery store, buy most  of your food from the areas near the walls of the store. This includes: Fresh fruits and vegetables (produce). Grains, beans, nuts, and seeds. Some of these may be available in unpackaged forms or large amounts (in bulk). Fresh seafood. Poultry and eggs. Low-fat dairy products. Buy whole ingredients instead of prepackaged foods. Buy fresh fruits and vegetables in-season from local farmers markets. Buy frozen fruits and vegetables in resealable bags. If you do not have access to quality fresh seafood, buy precooked frozen shrimp or canned fish, such as tuna, salmon, or sardines. Buy small amounts of raw or cooked vegetables, salads, or olives from the deli or salad bar at your store. Stock your pantry so you always have certain foods on hand, such as olive oil, canned tuna, canned tomatoes, rice, pasta, and beans. Cooking  Cook foods with extra-virgin olive oil instead of using butter or other vegetable oils. Have meat as a side dish, and have vegetables or grains as your main dish. This means having meat in small portions or adding small amounts of meat to foods like pasta or stew. Use beans or vegetables instead of meat in common dishes like chili or lasagna. Experiment with different cooking methods. Try roasting or broiling vegetables instead of steaming or sauteing them. Add frozen vegetables to  soups, stews, pasta, or rice. Add nuts or seeds for added healthy fat at each meal. You can add these to yogurt, salads, or vegetable dishes. Marinate fish or vegetables using olive oil, lemon juice, garlic, and fresh herbs. Meal planning  Plan to eat 1 vegetarian meal one day each week. Try to work up to 2 vegetarian meals, if possible. Eat seafood 2 or more times a week. Have healthy snacks readily available, such as: Vegetable sticks with hummus. Greek yogurt. Fruit and nut trail mix. Eat balanced meals throughout the week. This includes: Fruit: 2-3 servings a day Vegetables: 4-5 servings  a day Low-fat dairy: 2 servings a day Fish, poultry, or lean meat: 1 serving a day Beans and legumes: 2 or more servings a week Nuts and seeds: 1-2 servings a day Whole grains: 6-8 servings a day Extra-virgin olive oil: 3-4 servings a day Limit red meat and sweets to only a few servings a month What are my food choices? Mediterranean diet Recommended Grains: Whole-grain pasta. Brown rice. Bulgar wheat. Polenta. Couscous. Whole-wheat bread. Dwyane Glad. Vegetables: Artichokes. Beets. Broccoli. Cabbage. Carrots. Eggplant. Green beans. Chard. Kale. Spinach. Onions. Leeks. Peas. Squash. Tomatoes. Peppers. Radishes. Fruits: Apples. Apricots. Avocado. Berries. Bananas. Cherries. Dates. Figs. Grapes. Lemons. Melon. Oranges. Peaches. Plums. Pomegranate. Meats and other protein foods: Beans. Almonds. Sunflower seeds. Pine nuts. Peanuts. Cod. Salmon. Scallops. Shrimp. Tuna. Tilapia. Clams. Oysters. Eggs. Dairy: Low-fat milk. Cheese. Greek yogurt. Beverages: Water. Red wine. Herbal tea. Fats and oils: Extra virgin olive oil. Avocado oil. Grape seed oil. Sweets and desserts: [place] yogurt with honey. Baked apples. Poached pears. Trail mix. Seasoning and other foods: Basil. Cilantro. Coriander. Cumin. Mint. Parsley. Sage. Rosemary. Tarragon. Garlic. Oregano. Thyme. Pepper. Balsalmic vinegar. Tahini. Hummus. Tomato sauce. Olives. Mushrooms. Limit these Grains: Prepackaged pasta or rice dishes. Prepackaged cereal with added sugar. Vegetables: Deep fried potatoes (french fries). Fruits: Fruit canned in syrup. Meats and other protein foods: Beef. Pork. Lamb. Poultry with skin. Hot dogs. Helene Loader. Dairy: Ice cream. Sour cream. Whole milk. Beverages: Juice. Sugar-sweetened soft drinks. Beer. Liquor and spirits. Fats and oils: Butter. Canola oil. Vegetable oil. Beef fat (tallow). Lard. Sweets and desserts: Cookies. Cakes. Pies. Candy. Seasoning and other foods: Mayonnaise. Premade sauces and marinades. The  items listed may not be a complete list. Talk with your dietitian about what dietary choices are right for you. Summary The Mediterranean diet includes both food and lifestyle choices. Eat a variety of fresh fruits and vegetables, beans, nuts, seeds, and whole grains. Limit the amount of red meat and sweets that you eat. Talk with your health care provider about whether it is safe for you to drink red wine in moderation. This means 1 glass a day for nonpregnant women and 2 glasses a day for men. A glass of wine equals 5 oz (150 mL). This information is not intended to replace advice given to you by your health care provider. Make sure you discuss any questions you have with your health care provider. Document Released: 09/05/2015 Document Revised: 10/08/2015 Document Reviewed: 09/05/2015 Elsevier Interactive Patient Education  2017 [employer].

## 2023-06-24 NOTE — Progress Notes (Signed)
 Assessment/Plan:   Dementia likely due to Alzheimer's disease  Rebecca Gross is a very pleasant 78 y.o. RH female with a history of dementia likely due to Alzheimer's disease seen today in follow up for memory loss. Patient is currently on donepezil  10 mg daily. Cognitive decline is noted. MMSE today is 18/30.Discussed adding memantine  5 mg bid, with goal of a 10 mg bid dose if tolerated. Patient is able to participate on ADLs and to drive very short distances with husband as a Engineer, materials. Mood is fair.       Follow up in 6  months. Continue donepezil  10 mg daily, Side effects discussed Repeat MRI of the brain in 6 months for meningioma follow-up Continue B12 supplements Recommend good control of her cardiovascular risk factors Continue to control mood as per PCP, she is on Zoloft Monitor driving     Subjective:    This patient is accompanied in the office by her husband who supplements the history.  Previous records as well as any outside records available were reviewed prior to todays visit. Patient was last seen on 01/01/2023 with MMSE 23/30    Any changes in memory since last visit? "A little worse".  She continues to have trouble operating instruments such as the remote, cell phone, how to use the ATM, or operating the thermostat.  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  She may ask about her deceased relatives not realizing that they are no longer here.   Repeats oneself?  Endorsed Disoriented when walking into a room? Denies    Leaving objects?  May misplace things and then cannot find them or may take the TV apart. She may place a bread under the kitchen sink   Wandering behavior?  denies   Any personality changes since last visit?  She has moments of irritability as before, controlled better with Zoloft Any worsening depression?:  Denies.   Hallucinations or paranoia?  Denies.   Seizures? denies    Any sleep changes?  Sleeps well, occasionally she takes NyQuil.  She drinks  coffee late at night . Denies vivid dreams, REM behavior or sleepwalking   Sleep apnea?   Denies.   Any hygiene concerns? Denies.  Independent of bathing and dressing?  Endorsed  Does the patient needs help with medications?  Husband is in charge   Who is in charge of the finances?  Husband is in charge     Any changes in appetite?  Denies. May not be drinking enough water  Patient have trouble swallowing? Denies.   Does the patient cook? No Any headaches?   Denies.   Any vision changes? Denies  Chronic back pain  denies   Ambulates with difficulty? Denies.  Uses the treadmill frequently  Recent falls or head injuries? Denies.     Unilateral weakness, numbness or tingling? Denies.   Any tremors?  Denies   Any anosmia?  Denies   Any incontinence of urine?  Denies  Any bowel dysfunction?   Denies      Patient lives with her husband     Does the patient drive?  Very short distances, husband monitors who has been doing most of it   MRI brain 10/2022 personally reviewed   remarkable for moderate chronic small-vessel ischemic changes of the deep and subcortical white matter of both cerebral hemispheres, not visibly progressive since the prior exams and the known 14 x 16 mm heavily calcified meningioma at the lateral floor of the anterior cranial fossa on the right, unchanged with slight indentation upon the brain but no evidence of edema or brain gliosis      Initial visit 04/07/2022 How long did patient have memory difficulties?  Husband says it is for the last 10 months to 1 year. She denies any issues, "everyone is forgetful".  Her husband reports that she has trouble with remembering how to perform certain  tasks, for example recently she could not figure out how to use ATM cards, the remote, the washer and the thermostat.  Patient becomes very defensive, stating that is not true.  She has forgotten some people that recently saw.  repeats oneself?  Endorsed by family "over and over again", although patient denies.  There is some repetitive behavior, including purchasing items forgetting that she already bought them recently, then losing them.  Chart reports that  she bought an iPad and 2 apple watches and then lost them.  Disoriented when walking into a room?  Patient denies   Leaving objects in unusual places?   Endorsed, she placed bottles of wine under the bed.  Wandering behavior? denies   Any personality changes since last visit?  She is easily irritable, defensive. Any history of depression?:  denies   Hallucinations or paranoia?  Family reports that she may be borderline paranoid, becoming very defensive when approached about her memory. Seizures? denies    Any sleep changes?  Denies vivid dreams, REM behavior or sleepwalking.  "I don't take a pill anymore ".  Sleep apnea? denies   Any hygiene concerns?  denies   Independent of bathing and dressing?  Endorsed  Does the patient need help with medications?  Husband fixes the meds and she is in charge   Who is in charge of the finances? Husband  is in charge because she overpaid them  (for the last 6 months)    Any changes in appetite?   denies     Patient have trouble swallowing?  denies   Does the patient cook?  Yes Any kitchen accidents such as leaving the stove on? Patient denies   Any headaches?  denies   Chronic back pain?  denies   Ambulates with difficulty? denies   Recent falls or head injuries? denies     Vision changes? Unilateral weakness, numbness or tingling?  denies   Any tremors?  denies   Any anosmia?  denies   Any incontinence of urine? denies   Any bowel dysfunction?    denies      Patient lives  with husband    History of heavy alcohol intake? Denies.  She states that the bottle may last 3 days. History of heavy tobacco use? denies   Family history of dementia?   Mother and sister, and, brother had Alzheimer's dementia.  Dose patient drive? Denies any issues, denies getting lost   PREVIOUS MEDICATIONS:   CURRENT MEDICATIONS:  Outpatient Encounter Medications as of 06/24/2023  Medication Sig   amLODipine (NORVASC) 5 MG tablet    cholecalciferol 25 MCG (1000 UT) tablet    Cyanocobalamin  (VITAMIN B 12) 100 MCG LOZG daily.   memantine (NAMENDA) 5 MG tablet Take 1 tablet (5 mg at night) for 2 weeks, then increase to 1 tablet (5 mg) twice a day   pravastatin (PRAVACHOL) 20 MG tablet    rosuvastatin (CRESTOR) 10 MG tablet    sertraline (ZOLOFT) 50 MG tablet    Turmeric 500 MG CAPS as directed Orally   [DISCONTINUED] donepezil  (ARICEPT ) 10 MG tablet Take  1 tablet 10 mg daily.   donepezil  (ARICEPT ) 10 MG tablet Take  1 tablet 10 mg daily.   No facility-administered encounter medications on file as of 06/24/2023.       06/24/2023    5:00 PM 01/11/2023    1:00 PM  MMSE - Mini Mental State Exam  Orientation to time 2 4  Orientation to Place 0 3  Registration 3 3  Attention/ Calculation 5 5  Recall 0 0  Language- name 2 objects 2 2  Language- repeat 1 1  Language- follow 3 step command 3 3  Language- read & follow direction 1 1  Write a sentence 1 1  Copy design 0 0  Total score 18 23      04/07/2022   10:00 AM  Montreal Cognitive Assessment   Visuospatial/ Executive (0/5) 2  Naming (0/3) 1  Attention: Read list of digits (0/2) 2  Attention: Read list of letters (0/1) 1  Attention: Serial 7 subtraction starting at 100 (0/3) 0  Language: Repeat phrase (0/2) 2  Language : Fluency (0/1) 1  Abstraction (0/2) 0  Delayed Recall (0/5) 0  Orientation (0/6) 3  Total 12  Adjusted Score (based on education) 12    Objective:     PHYSICAL EXAMINATION:    VITALS:   Vitals:   06/24/23  1506 06/24/23 1531  BP: (!) 150/80 (!) 148/80  Pulse: 66   Resp: 20   SpO2: 98%   Height: 5\' 2"  (1.575 m)     GEN:  The patient appears stated age and is in NAD. HEENT:  Normocephalic, atraumatic.   Neurological examination:  General: NAD, well-groomed, appears stated age. Orientation: The patient is alert. Oriented to person, place and date Cranial nerves: There is good facial symmetry.The speech is fluent and clear. No aphasia or dysarthria. Fund of knowledge is appropriate. Recent and remote memory are impaired. Attention and concentration are reduced. Able to name objects and repeat phrases.  Hearing is intact to conversational tone.  Sensation: Sensation is intact to light touch throughout Motor: Strength is at least antigravity x4. DTR's 2/4 in UE/LE     Movement examination: Tone: There is normal tone in the UE/LE Abnormal movements:  no tremor.  No myoclonus.  No asterixis.   Coordination:  There is no decremation with RAM's. Normal finger to nose  Gait and Station: The patient has no difficulty arising out of a deep-seated chair without the use of the hands. The patient's stride length is good.  Gait  is cautious and narrow.    Thank you for allowing us  the opportunity to participate in the care of this nice patient. Please do not hesitate to contact us  for any questions or concerns.   Total time spent on today's visit was 22 minutes dedicated to this patient today, preparing to see patient, examining the patient, ordering tests and/or medications and counseling the patient, documenting clinical information in the EHR or other health record, independently interpreting results and communicating results to the patient/family, discussing treatment and goals, answering patient's questions and coordinating care.  Cc:  Glena Landau, MD  Tex Filbert 06/24/2023 5:53 PM

## 2023-07-12 ENCOUNTER — Ambulatory Visit: Payer: Medicare Other | Admitting: Physician Assistant

## 2023-08-18 DIAGNOSIS — Z1231 Encounter for screening mammogram for malignant neoplasm of breast: Secondary | ICD-10-CM | POA: Diagnosis not present

## 2023-09-08 DIAGNOSIS — G309 Alzheimer's disease, unspecified: Secondary | ICD-10-CM | POA: Diagnosis not present

## 2023-09-08 DIAGNOSIS — I10 Essential (primary) hypertension: Secondary | ICD-10-CM | POA: Diagnosis not present

## 2023-11-10 DIAGNOSIS — Z23 Encounter for immunization: Secondary | ICD-10-CM | POA: Diagnosis not present

## 2023-11-10 DIAGNOSIS — E7800 Pure hypercholesterolemia, unspecified: Secondary | ICD-10-CM | POA: Diagnosis not present

## 2023-11-10 DIAGNOSIS — I10 Essential (primary) hypertension: Secondary | ICD-10-CM | POA: Diagnosis not present

## 2023-11-10 DIAGNOSIS — E669 Obesity, unspecified: Secondary | ICD-10-CM | POA: Diagnosis not present

## 2023-11-10 DIAGNOSIS — G309 Alzheimer's disease, unspecified: Secondary | ICD-10-CM | POA: Diagnosis not present

## 2023-11-10 DIAGNOSIS — G319 Degenerative disease of nervous system, unspecified: Secondary | ICD-10-CM | POA: Diagnosis not present

## 2023-11-10 DIAGNOSIS — Z0000 Encounter for general adult medical examination without abnormal findings: Secondary | ICD-10-CM | POA: Diagnosis not present

## 2023-11-10 DIAGNOSIS — Z1331 Encounter for screening for depression: Secondary | ICD-10-CM | POA: Diagnosis not present

## 2023-11-10 DIAGNOSIS — R7301 Impaired fasting glucose: Secondary | ICD-10-CM | POA: Diagnosis not present

## 2023-11-10 DIAGNOSIS — M8580 Other specified disorders of bone density and structure, unspecified site: Secondary | ICD-10-CM | POA: Diagnosis not present

## 2024-01-03 ENCOUNTER — Ambulatory Visit: Admitting: Physician Assistant

## 2024-01-03 ENCOUNTER — Encounter: Payer: Self-pay | Admitting: Physician Assistant

## 2024-01-03 VITALS — BP 168/80 | HR 63 | Resp 20 | Ht 62.0 in | Wt 187.0 lb

## 2024-01-03 DIAGNOSIS — Z86011 Personal history of benign neoplasm of the brain: Secondary | ICD-10-CM | POA: Diagnosis not present

## 2024-01-03 DIAGNOSIS — F0393 Unspecified dementia, unspecified severity, with mood disturbance: Secondary | ICD-10-CM

## 2024-01-03 NOTE — Progress Notes (Signed)
 Assessment & Plan  Dementia likely due to Alzheimer's disease with mood disturbance   Rebecca Gross is a very pleasant 78 y.o. RH female with a history of dementia likely due to Alzheimer's disease seen today in follow up for memory loss. Patient is currently on donepezil  10 mg daily and memantine  10 mg twice daily, recently increased by her PCP.  This patient is accompanied in the office by her husband who supplements the histoCgnitive decline is noted, accompanied by increased confabulatory episodes.  Patient is able to participate on ADLs, no longer drives. Mood is controlled by her PCP  Continue donepezil  10 mg daily and memantine  to 10 mg twice daily, side effects discussed. Consider increasing donepezil  to 23 mg at some point, husband will inform us . Repeat MRI of the brain for follow-up on her meningioma Continue B12 supplements Continue to control mood as per PCP, reduce high caffeine intake Follow-up in 6 months    Discussed the use of AI scribe software for clinical note transcription with the patient, who gave verbal consent to proceed.  History of Present Illness Rebecca Gross is a 78 year old female who presents with worsening memory issues.  Since her last visit in May, her memory issues have worsened. She struggles with operating household instruments such as the remote control, cell phone, washing machine, and thermostat. She sometimes inquires about relatives who are no longer present and has difficulty recognizing people in photographs, although she can recognize them in person. She frequently repeats herself and asks the same questions more often than before. She misplaces items like her cell phone, pocketbook, and checkbook, often finding them in unusual places such as the kitchen cabinet or bedroom drawer, under the bed.She still has her checkbook but her husband manages the finances. She has not fallen victim to any scams.  She experiences moments of  irritability and anger, which her husband notes as more frequent. She is currently taking Zoloft for mood stabilization, with no recent changes in dosage. No hallucinations, paranoia, or seizure activity are reported.  Her sleep is reportedly good though she consumes a quart or more of strong coffee throughout the day. She has vivid dreams at times, and her husband notes she talks in her sleep and snores heavily. She has not been tested for sleep apnea.  She maintains a good appetite, favoring sweets, and drinks water with turmeric. No issues with swallowing are reported. She is not currently using the treadmill but stays active by jogging up and down stairs at home. No falls, head injuries, or symptoms of stroke are reported.  There are conflicting reports regarding her driving status: the patient states she still drives occasionally, primarily to church or the store, while her husband reports she has not driven in about 2.5-3 months after getting lost, and he has taken over most driving responsibilities.  She is currently taking donepezil  10 mg daily and memantine  10 mg twice a day for her memory issues. Her husband manages her medications.      MRI brain 10/2022 personally reviewed   remarkable for moderate chronic small-vessel ischemic changes of the deep and subcortical white matter of both cerebral hemispheres, not visibly progressive since the prior exams and the known 14 x 16 mm heavily calcified meningioma at the lateral floor of the anterior cranial fossa on the right, unchanged with slight indentation upon the brain but no evidence of edema or brain gliosis       Initial visit  04/07/2022 How long did patient have memory difficulties?  Husband says it is for the last 10 months to 1 year. She denies any issues, everyone is forgetful.  Her husband reports that she has trouble with remembering how to perform certain tasks, for example recently she could not figure out how to use ATM cards, the  remote, the washer and the thermostat.  Patient becomes very defensive, stating that is not true.  She has forgotten some people that recently saw.  repeats oneself?  Endorsed by family over and over again, although patient denies.  There is some repetitive behavior, including purchasing items forgetting that she already bought them recently, then losing them.  Chart reports that  she bought an iPad and 2 apple watches and then lost them.  Disoriented when walking into a room?  Patient denies   Leaving objects in unusual places?   Endorsed, she placed bottles of wine under the bed.  Wandering behavior? denies   Any personality changes since last visit?  She is easily irritable, defensive. Any history of depression?:  denies   Hallucinations or paranoia?  Family reports that she may be borderline paranoid, becoming very defensive when approached about her memory. Seizures? denies    Any sleep changes?  Denies vivid dreams, REM behavior or sleepwalking.  I don't take a pill anymore .  Sleep apnea? denies   Any hygiene concerns?  denies   Independent of bathing and dressing?  Endorsed  Does the patient need help with medications?  Husband fixes the meds and she is in charge   Who is in charge of the finances? Husband  is in charge because she overpaid them  (for the last 6 months)    Any changes in appetite?   denies     Patient have trouble swallowing?  denies   Does the patient cook?  Yes Any kitchen accidents such as leaving the stove on? Patient denies   Any headaches?  denies   Chronic back pain?  denies   Ambulates with difficulty? denies   Recent falls or head injuries? denies     Vision changes? Unilateral weakness, numbness or tingling?  denies   Any tremors?  denies   Any anosmia?  denies   Any incontinence of urine? denies   Any bowel dysfunction?    denies      Patient lives with husband   History of heavy alcohol intake? Denies.  She states that the bottle may last 3  days. History of heavy tobacco use? denies   Family history of dementia?   Mother and sister, and, brother had Alzheimer's dementia.  Dose patient drive? Denies any issues, denies getting lost           06/24/2023    5:00 PM 01/11/2023    1:00 PM  MMSE - Mini Mental State Exam  Orientation to time 2 4  Orientation to Place 0 3  Registration 3 3  Attention/ Calculation 5 5  Recall 0 0  Language- name 2 objects 2 2  Language- repeat 1 1  Language- follow 3 step command 3 3  Language- read & follow direction 1 1  Write a sentence 1 1  Copy design 0 0  Total score 18 23      04/07/2022   10:00 AM  Montreal Cognitive Assessment   Visuospatial/ Executive (0/5) 2  Naming (0/3) 1  Attention: Read list of digits (0/2) 2  Attention: Read list of letters (0/1) 1  Attention:  Serial 7 subtraction starting at 100 (0/3) 0  Language: Repeat phrase (0/2) 2  Language : Fluency (0/1) 1  Abstraction (0/2) 0  Delayed Recall (0/5) 0  Orientation (0/6) 3  Total 12  Adjusted Score (based on education) 12      Objective:    Neurological Exam:    VITALS:   Vitals:   01/03/24 1248 01/03/24 1250  BP: (!) 177/79 (!) 168/80  Pulse: 63   Resp: 20   SpO2: 97%   Weight: 187 lb (84.8 kg)   Height: 5' 2 (1.575 m)     GEN:  The patient appears stated age and is in NAD. HEENT:  Normocephalic, atraumatic.   Neurological examination:  General: NAD, well-groomed, appears stated age. Decreased insight into her memory issues Orientation: The patient is alert. Oriented to person, not to date or to place Cranial nerves: There is good facial symmetry.The speech is fluent and clear. No aphasia or dysarthria. Fund of knowledge is reduced . Recent and remote memory are impaired. Attention and concentration are reduced. Able to name objects and repeat phrases.  Hearing is intact to conversational tone.   Sensation: Sensation is intact to light touch throughout Motor: Strength is at least  antigravity x4. DTR's 2/4 in UE/LE     Movement examination:  Tone: There is normal tone in the UE/LE Abnormal movements:  no tremor.  No myoclonus.  No asterixis.   Coordination:  There is no decremation with RAM's. Normal finger to nose  Gait and Station: The patient has no difficulty arising out of a deep-seated chair without the use of the hands. The patient's stride length is good.  Gait is cautious and narrow.    Thank you for allowing us  the opportunity to participate in the care of this nice patient. Please do not hesitate to contact us  for any questions or concerns.   Total time spent on today's visit was 36 minutes dedicated to this patient today, preparing to see patient, examining the patient, ordering tests and/or medications and counseling the patient, documenting clinical information in the EHR or other health record, independently interpreting results and communicating results to the patient/family, discussing treatment and goals, answering patient's questions and coordinating care.  Cc:  Loreli Kins, MD  Camie Sevin 01/03/2024 1:40 PM

## 2024-01-03 NOTE — Patient Instructions (Addendum)
 It was a pleasure to see you today at our office.   Recommendations:     Continue Donepezil  10 mg daily.   Continue Memantine  10 mg twice a day   Repeat the MRI of the brain for meningioma Recommend good control of cardiovascular risk factors.   Continue to control mood as per PCP Follow in 6 months   Visit [facility]    For assessment of decision of mental capacity and competency:  Call Dr. Rosaline Nine, geriatric psychiatrist at [phone]   Whom to call:  Memory  decline, memory medications: Call our office [phone]   For psychiatric meds, mood meds: Please have your primary care physician manage these medications.    If you have any severe symptoms of a stroke, or other severe issues such as confusion,severe chills or fever, etc call 911 or go to the ER as you may need to be evaluated further       RECOMMENDATIONS FOR ALL PATIENTS WITH MEMORY PROBLEMS: 1. Continue to exercise (Recommend 30 minutes of walking everyday, or 3 hours every week) 2. Increase social interactions - continue going to [place] and enjoy social gatherings with friends and family 3. Eat healthy, avoid fried foods and eat more fruits and vegetables 4. Maintain adequate blood pressure, blood sugar, and blood cholesterol level. Reducing the risk of stroke and cardiovascular disease also helps promoting better memory. 5. Avoid stressful situations. Live a simple life and avoid aggravations. Organize your time and prepare for the next day in anticipation. 6. Sleep well, avoid any interruptions of sleep and avoid any distractions in the bedroom that may interfere with adequate sleep quality 7. Avoid sugar, avoid sweets as there is a strong link between excessive sugar intake, diabetes, and cognitive impairment We discussed the Mediterranean diet, which has been shown to help patients reduce the risk of progressive memory disorders and reduces cardiovascular risk. This includes eating fish,  eat fruits and green leafy vegetables, nuts like almonds and hazelnuts, walnuts, and also use olive oil. Avoid fast foods and fried foods as much as possible. Avoid sweets and sugar as sugar use has been linked to worsening of memory function.  There is always a concern of gradual progression of memory problems. If this is the case, then we may need to adjust level of care according to patient needs. Support, both to the patient and caregiver, should then be put into place.        FALL PRECAUTIONS: Be cautious when walking. Scan the area for obstacles that may increase the risk of trips and falls. When getting up in the mornings, sit up at the edge of the bed for a few minutes before getting out of bed. Consider elevating the bed at the head end to avoid drop of blood pressure when getting up. Walk always in a well-lit room (use night lights in the walls). Avoid area rugs or power cords from appliances in the middle of the walkways. Use a walker or a cane if necessary and consider physical therapy for balance exercise. Get your eyesight checked regularly.  FINANCIAL OVERSIGHT: Supervision, especially oversight when making financial decisions or transactions is also recommended.  HOME SAFETY: Consider the safety of the kitchen when operating appliances like stoves, microwave oven, and blender. Consider having supervision and share cooking responsibilities until no longer able to participate in those. Accidents with firearms and other hazards in the house should be identified and addressed as well.   ABILITY TO BE LEFT ALONE:  If patient is unable to contact 911 operator, consider using LifeLine, or when the need is there, arrange for someone to stay with patients. Smoking is a fire hazard, consider supervision or cessation. Risk of wandering should be assessed by caregiver and if detected at any point, supervision and safe proof recommendations should be instituted.  MEDICATION SUPERVISION: Inability  to self-administer medication needs to be constantly addressed. Implement a mechanism to ensure safe administration of the medications.       Mediterranean Diet A Mediterranean diet refers to food and lifestyle choices that are based on the traditions of countries located on the [employer] [place]. This way of eating has been shown to help prevent certain conditions and improve outcomes for people who have chronic diseases, like kidney disease and heart disease. What are tips for following this plan? Lifestyle  Cook and eat meals together with your family, when possible. Drink enough fluid to keep your urine clear or pale yellow. Be physically active every day. This includes: Aerobic exercise like running or swimming. Leisure activities like gardening, walking, or housework. Get 7-8 hours of sleep each night. If recommended by your health care provider, drink red wine in moderation. This means 1 glass a day for nonpregnant women and 2 glasses a day for men. A glass of wine equals 5 oz (150 mL). Reading food labels  Check the serving size of packaged foods. For foods such as rice and pasta, the serving size refers to the amount of cooked product, not dry. Check the total fat in packaged foods. Avoid foods that have saturated fat or trans fats. Check the ingredients list for added sugars, such as corn syrup. Shopping  At the grocery store, buy most of your food from the areas near the walls of the store. This includes: Fresh fruits and vegetables (produce). Grains, beans, nuts, and seeds. Some of these may be available in unpackaged forms or large amounts (in bulk). Fresh seafood. Poultry and eggs. Low-fat dairy products. Buy whole ingredients instead of prepackaged foods. Buy fresh fruits and vegetables in-season from local farmers markets. Buy frozen fruits and vegetables in resealable bags. If you do not have access to quality fresh seafood, buy precooked frozen shrimp or canned fish,  such as tuna, salmon, or sardines. Buy small amounts of raw or cooked vegetables, salads, or olives from the deli or salad bar at your store. Stock your pantry so you always have certain foods on hand, such as olive oil, canned tuna, canned tomatoes, rice, pasta, and beans. Cooking  Cook foods with extra-virgin olive oil instead of using butter or other vegetable oils. Have meat as a side dish, and have vegetables or grains as your main dish. This means having meat in small portions or adding small amounts of meat to foods like pasta or stew. Use beans or vegetables instead of meat in common dishes like chili or lasagna. Experiment with different cooking methods. Try roasting or broiling vegetables instead of steaming or sauteing them. Add frozen vegetables to soups, stews, pasta, or rice. Add nuts or seeds for added healthy fat at each meal. You can add these to yogurt, salads, or vegetable dishes. Marinate fish or vegetables using olive oil, lemon juice, garlic, and fresh herbs. Meal planning  Plan to eat 1 vegetarian meal one day each week. Try to work up to 2 vegetarian meals, if possible. Eat seafood 2 or more times a week. Have healthy snacks readily available, such as: Vegetable sticks with hummus. Greek yogurt. Fruit  and nut trail mix. Eat balanced meals throughout the week. This includes: Fruit: 2-3 servings a day Vegetables: 4-5 servings a day Low-fat dairy: 2 servings a day Fish, poultry, or lean meat: 1 serving a day Beans and legumes: 2 or more servings a week Nuts and seeds: 1-2 servings a day Whole grains: 6-8 servings a day Extra-virgin olive oil: 3-4 servings a day Limit red meat and sweets to only a few servings a month What are my food choices? Mediterranean diet Recommended Grains: Whole-grain pasta. Brown rice. Bulgar wheat. Polenta. Couscous. Whole-wheat bread. [place] Madeira. Vegetables: Artichokes. Beets. Broccoli. Cabbage. Carrots. Eggplant. Green beans.  Chard. Kale. Spinach. Onions. Leeks. Peas. Squash. Tomatoes. Peppers. Radishes. Fruits: Apples. Apricots. Avocado. Berries. Bananas. Cherries. Dates. Figs. Grapes. Lemons. Melon. Oranges. Peaches. Plums. Pomegranate. Meats and other protein foods: Beans. Almonds. Sunflower seeds. Pine nuts. Peanuts. Cod. Salmon. Scallops. Shrimp. Tuna. Tilapia. Clams. Oysters. Eggs. Dairy: Low-fat milk. Cheese. Greek yogurt. Beverages: Water. Red wine. Herbal tea. Fats and oils: Extra virgin olive oil. Avocado oil. Grape seed oil. Sweets and desserts: Greek yogurt with honey. Baked apples. Poached pears. Trail mix. Seasoning and other foods: Basil. Cilantro. Coriander. Cumin. Mint. Parsley. Sage. Rosemary. Tarragon. Garlic. Oregano. Thyme. Pepper. Balsalmic vinegar. Tahini. Hummus. Tomato sauce. Olives. Mushrooms. Limit these Grains: Prepackaged pasta or rice dishes. Prepackaged cereal with added sugar. Vegetables: Deep fried potatoes (french fries). Fruits: Fruit canned in syrup. Meats and other protein foods: Beef. Pork. Lamb. Poultry with skin. Hot dogs. [place]. Dairy: Ice cream. Sour cream. Whole milk. Beverages: Juice. Sugar-sweetened soft drinks. Beer. Liquor and spirits. Fats and oils: Butter. [place] oil. Vegetable oil. Beef fat (tallow). Lard. Sweets and desserts: Cookies. Cakes. Pies. Candy. Seasoning and other foods: Mayonnaise. Premade sauces and marinades. The items listed may not be a complete list. Talk with your dietitian about what dietary choices are right for you. Summary The Mediterranean diet includes both food and lifestyle choices. Eat a variety of fresh fruits and vegetables, beans, nuts, seeds, and whole grains. Limit the amount of red meat and sweets that you eat. Talk with your health care provider about whether it is safe for you to drink red wine in moderation. This means 1 glass a day for nonpregnant women and 2 glasses a day for men. A glass of wine equals 5 oz (150 mL). This  information is not intended to replace advice given to you by your health care provider. Make sure you discuss any questions you have with your health care provider. Document Released: 09/05/2015 Document Revised: 10/08/2015 Document Reviewed: 09/05/2015 Elsevier Interactive Patient Education  2017 [employer].

## 2024-02-08 ENCOUNTER — Ambulatory Visit
Admission: RE | Admit: 2024-02-08 | Discharge: 2024-02-08 | Disposition: A | Source: Ambulatory Visit | Attending: Physician Assistant | Admitting: Physician Assistant

## 2024-02-08 DIAGNOSIS — Z86011 Personal history of benign neoplasm of the brain: Secondary | ICD-10-CM

## 2024-02-08 MED ORDER — GADOPICLENOL 0.5 MMOL/ML IV SOLN
9.0000 mL | Freq: Once | INTRAVENOUS | Status: AC | PRN
  Administered 2024-02-08: 9 mL via INTRAVENOUS

## 2024-02-09 ENCOUNTER — Ambulatory Visit: Payer: Self-pay | Admitting: Physician Assistant

## 2024-07-03 ENCOUNTER — Ambulatory Visit: Admitting: Physician Assistant
